# Patient Record
Sex: Female | Born: 1980 | Race: White | Hispanic: No | Marital: Married | Smoking: Never smoker
Health system: Southern US, Community
[De-identification: ages and names within clinical notes are randomized; demographics above are authoritative.]

## PROBLEM LIST (undated history)

## (undated) DIAGNOSIS — E282 Polycystic ovarian syndrome: Secondary | ICD-10-CM

## (undated) DIAGNOSIS — K5900 Constipation, unspecified: Secondary | ICD-10-CM

## (undated) DIAGNOSIS — B009 Herpesviral infection, unspecified: Secondary | ICD-10-CM

## (undated) DIAGNOSIS — E079 Disorder of thyroid, unspecified: Secondary | ICD-10-CM

## (undated) DIAGNOSIS — I10 Essential (primary) hypertension: Secondary | ICD-10-CM

## (undated) DIAGNOSIS — E119 Type 2 diabetes mellitus without complications: Secondary | ICD-10-CM

## (undated) HISTORY — DX: Herpesviral infection, unspecified: B00.9

## (undated) HISTORY — DX: Polycystic ovarian syndrome: E28.2

---

## 1998-08-20 ENCOUNTER — Other Ambulatory Visit: Admission: RE | Admit: 1998-08-20 | Discharge: 1998-08-20 | Payer: Self-pay | Admitting: Family Medicine

## 2001-05-16 ENCOUNTER — Emergency Department (HOSPITAL_COMMUNITY): Admission: EM | Admit: 2001-05-16 | Discharge: 2001-05-17 | Payer: Self-pay | Admitting: Emergency Medicine

## 2003-10-05 ENCOUNTER — Other Ambulatory Visit: Admission: RE | Admit: 2003-10-05 | Discharge: 2003-10-05 | Payer: Self-pay | Admitting: Obstetrics and Gynecology

## 2005-02-22 ENCOUNTER — Emergency Department (HOSPITAL_COMMUNITY): Admission: EM | Admit: 2005-02-22 | Discharge: 2005-02-22 | Payer: Self-pay | Admitting: Emergency Medicine

## 2005-02-28 ENCOUNTER — Other Ambulatory Visit: Admission: RE | Admit: 2005-02-28 | Discharge: 2005-02-28 | Payer: Self-pay | Admitting: Obstetrics and Gynecology

## 2006-04-16 ENCOUNTER — Other Ambulatory Visit: Admission: RE | Admit: 2006-04-16 | Discharge: 2006-04-16 | Payer: Self-pay | Admitting: Obstetrics and Gynecology

## 2010-01-27 ENCOUNTER — Encounter: Payer: Self-pay | Admitting: Sports Medicine

## 2011-02-08 ENCOUNTER — Ambulatory Visit (INDEPENDENT_AMBULATORY_CARE_PROVIDER_SITE_OTHER): Payer: 59 | Admitting: Family Medicine

## 2011-02-08 VITALS — BP 138/98 | HR 78 | Temp 99.4°F | Resp 18 | Ht 60.75 in | Wt 187.0 lb

## 2011-02-08 DIAGNOSIS — E042 Nontoxic multinodular goiter: Secondary | ICD-10-CM

## 2011-02-08 DIAGNOSIS — E039 Hypothyroidism, unspecified: Secondary | ICD-10-CM

## 2011-02-08 DIAGNOSIS — E049 Nontoxic goiter, unspecified: Secondary | ICD-10-CM

## 2011-02-08 MED ORDER — LEVOTHYROXINE SODIUM 50 MCG PO TABS: 50.0000 ug | ORAL_TABLET | Freq: Every day | ORAL | Status: DC

## 2011-02-08 NOTE — Progress Notes (Signed)
  Subjective:    Patient ID: Natasha Love, female    DOB: [birth year], 31 y.o.   MRN: [id number]  HPI 31 yo female here to discuss thyroid disease.  She went to gyn for menstrual irregularity [facility] - Lynden Ang).  01/28/11 TSH was 14.2.  Was told to see PCP for further treatment. Has had trouble with infertility and is hopeful fixing this will improve her fertility.    Review of Systems Menstrual irregularity, fatigue/hypersomnia, temperature extremes    Objective:   Physical Exam  Constitutional: She appears well-developed and well-nourished.  Neck: Thyromegaly present.       Thyroid nodularity on right  Cardiovascular: Normal rate, regular rhythm, normal heart sounds and intact distal pulses.   Pulmonary/Chest: Effort normal and breath sounds normal.          Assessment & Plan:  Hypothyroidism and Goiter/thyroid nodularity. Obtain ultrasound of thyroid.  Start levothyroxind daily and follow-up in 6 weeks for repeat labs.

## 2011-02-14 ENCOUNTER — Other Ambulatory Visit: Payer: Self-pay

## 2011-02-25 ENCOUNTER — Emergency Department (HOSPITAL_COMMUNITY)
Admission: EM | Admit: 2011-02-25 | Discharge: 2011-02-25 | Disposition: A | Discharge status: Home Health | Payer: Self-pay | Attending: Emergency Medicine | Admitting: Emergency Medicine

## 2011-02-25 ENCOUNTER — Encounter (HOSPITAL_COMMUNITY): Payer: Self-pay

## 2011-02-25 DIAGNOSIS — R110 Nausea: Secondary | ICD-10-CM | POA: Insufficient documentation

## 2011-02-25 MED ORDER — ONDANSETRON 4 MG PO TBDP: 4.0000 mg | ORAL_TABLET | Freq: Three times a day (TID) | ORAL | Status: AC | PRN

## 2011-02-25 MED ORDER — ONDANSETRON HCL 4 MG/2ML IJ SOLN
4.0000 mg | Freq: Once | INTRAMUSCULAR | Status: AC
  Administered 2011-02-25: 4 mg via INTRAMUSCULAR
  Filled 2011-02-25: qty 2

## 2011-02-25 NOTE — Discharge Instructions (Signed)
Nausea, Adult Nausea is the uncomfortable feeling you get in your stomach before you vomit. There are many causes of nausea and vomiting. Common examples include:   Viral infections.   Food poisoning.   Medications.   Pregnancy.   Motion sickness.   Migraine headaches.   Emotional distress.   Severe pain from any source.   Alcohol intoxication.  Treatment is aimed at the relief of symptoms. If able, prevention of vomiting and dehydration works best. Most viral infections and food poisoning symptoms will improve within 12 to 24 hours after the nausea first started. Some medications that cause nausea may be taken at bedtime. Avoid alcohol since this can irritate the stomach and make your symptoms worse. Get plenty of rest. Eat small amounts of food and sip liquids more often. Medications for nausea, vomiting, and motion sickness are given orally, by suppository, or by injection. SEEK IMMEDIATE MEDICAL CARE IF:   Your condition is not improved within 2 days or your condition gets worse in a few hours.   You develop repeated vomiting, blood in the vomit, or dark or bloody stools.   You develop severe chest or abdominal pain or a headache.   You develop fainting, extreme weakness, or body fluid loss (dehydration).   You have an oral temperature above 102 F (38.9 C), not controlled by medicine.  Document Released: 01/31/2004 Document Revised: 07/08/2010 Document Reviewed: 02/24/2008 Exit[employer] Patient Information 2012 [employer]. 

## 2011-02-25 NOTE — ED Provider Notes (Signed)
History     CSN: [id number]  Arrival date & time 02/25/11  1341   First MD Initiated Contact with Patient 02/25/11 1535      Chief Complaint  Patient presents with  . Nausea    (Consider location/radiation/quality/duration/timing/severity/associated sxs/prior treatment) HPI The patient presents to the emergency department with complaints of nausea. Patient was prescribed amoxicillin and Vicodin yesterday by her dentist for dental abscess. She states that since starting the medication she's been feeling very chest. She denies any episodes of vomiting, diarrhea, facial swelling, tongue swelling, shortness of breath, sensation of her throat closing, or any other related symptoms. She states that she has been drinking less water because of the nausea and that she needed something for this.   History reviewed. No pertinent past medical history.  History reviewed. No pertinent past surgical history.  No family history on file.  History  Substance Use Topics  . Smoking status: Never Smoker   . Smokeless tobacco: Not on file  . Alcohol Use: No    OB History    Grav Para Term Preterm Abortions TAB SAB Ect Mult Living                  Review of Systems  All other systems reviewed and are negative.    Allergies  Review of patient's allergies indicates no known allergies.  Home Medications   Current Outpatient Rx  Name Route Sig Dispense Refill  . AMOXICILLIN 500 MG PO CAPS Oral Take 500 mg by mouth every 6 (six) hours. Starting on 02/24/11 for 6 days    . HYDROCODONE-ACETAMINOPHEN 7.5-750 MG PO TABS Oral Take 1 tablet by mouth every 4 (four) hours as needed. For pain    . LEVOTHYROXINE SODIUM 50 MCG PO TABS Oral Take 1 tablet (50 mcg total) by mouth daily. 30 tablet 1  . ONDANSETRON 4 MG PO TBDP Oral Take 1 tablet (4 mg total) by mouth every 8 (eight) hours as needed for nausea. 20 tablet 0    BP 117/70  Pulse 64  Temp(Src) 98.2 F (36.8 C) (Oral)  Resp 16  Ht 5'  (1.524 m)  Wt 187 lb (84.823 kg)  BMI 36.52 kg/m2  SpO2 95%  LMP 02/09/2011  Physical Exam  Constitutional: She appears well-developed and well-nourished. No distress.  HENT:  Head: Normocephalic and atraumatic.  Mouth/Throat: Uvula is midline, oropharynx is clear and moist and mucous membranes are normal. Normal dentition. Dental caries (Pts tooth shows no obvious abscess but moderate to severe tenderness to palpation of marked tooth) present. No uvula swelling.  Eyes: Pupils are equal, round, and reactive to light.  Neck: Trachea normal, normal range of motion and full passive range of motion without pain. Neck supple.  Cardiovascular: Normal rate, regular rhythm, normal heart sounds and normal pulses.   Pulmonary/Chest: Effort normal and breath sounds normal. No respiratory distress. Chest wall is not dull to percussion. She exhibits no crepitus, no edema, no deformity and no retraction.  Abdominal: Soft. Normal appearance and bowel sounds are normal. She exhibits no distension. There is no tenderness. There is no guarding.  Musculoskeletal: Normal range of motion.  Neurological: She is alert. She has normal strength.  Skin: Skin is warm, dry and intact. She is not diaphoretic.  Psychiatric: She has a normal mood and affect. Her speech is normal.    ED Course  Procedures (including critical care time)  Labs Reviewed - No data to display No results found.   1. Nausea  MDM  Patient given shot of Zofran in ED, on reevaluation the patient is feeling much better and no longer having nausea. Pt able to drink cup of water without any problems.  Will give patient prescription for Zofran and have her followup with her dentist at her appointment scheduled for Friday for further management of her dental pain.        Dorthula Matas, PA 02/26/11 0020

## 2011-02-25 NOTE — ED Notes (Signed)
Pt. Is getting a tooth  Extracted next Friday, and her dentist called her in Amoxicillin and Vicodin and 1 hour after taking these meds she has had nausea.

## 2011-02-25 NOTE — ED Notes (Signed)
Pt c/o upper double impacted wisdom tooth and abscess to lower teeth, pt c/o nausea after taking her prescribed pain medication and dizzy. Pt states "I think this is from my pain medication."

## 2011-02-26 NOTE — ED Provider Notes (Signed)
Medical screening examination/treatment/procedure(s) were performed by non-physician practitioner and as supervising physician I was immediately available for consultation/collaboration.  Doug Sou, MD 02/26/11 [phone]

## 2011-12-22 ENCOUNTER — Ambulatory Visit: Payer: BC Managed Care – PPO

## 2013-05-06 ENCOUNTER — Encounter (HOSPITAL_BASED_OUTPATIENT_CLINIC_OR_DEPARTMENT_OTHER): Payer: Self-pay | Admitting: Emergency Medicine

## 2013-05-06 ENCOUNTER — Emergency Department (HOSPITAL_BASED_OUTPATIENT_CLINIC_OR_DEPARTMENT_OTHER)
Admission: EM | Admit: 2013-05-06 | Discharge: 2013-05-06 | Disposition: A | Discharge status: Home / Self Care | Payer: BC Managed Care – PPO | Attending: Emergency Medicine | Admitting: Emergency Medicine

## 2013-05-06 DIAGNOSIS — K602 Anal fissure, unspecified: Secondary | ICD-10-CM | POA: Insufficient documentation

## 2013-05-06 DIAGNOSIS — Z79899 Other long term (current) drug therapy: Secondary | ICD-10-CM | POA: Insufficient documentation

## 2013-05-06 DIAGNOSIS — E119 Type 2 diabetes mellitus without complications: Secondary | ICD-10-CM | POA: Insufficient documentation

## 2013-05-06 DIAGNOSIS — I10 Essential (primary) hypertension: Secondary | ICD-10-CM | POA: Insufficient documentation

## 2013-05-06 DIAGNOSIS — E079 Disorder of thyroid, unspecified: Secondary | ICD-10-CM | POA: Insufficient documentation

## 2013-05-06 DIAGNOSIS — K5900 Constipation, unspecified: Secondary | ICD-10-CM | POA: Insufficient documentation

## 2013-05-06 DIAGNOSIS — K5909 Other constipation: Secondary | ICD-10-CM

## 2013-05-06 DIAGNOSIS — R439 Unspecified disturbances of smell and taste: Secondary | ICD-10-CM | POA: Insufficient documentation

## 2013-05-06 DIAGNOSIS — F411 Generalized anxiety disorder: Secondary | ICD-10-CM | POA: Insufficient documentation

## 2013-05-06 HISTORY — DX: Constipation, unspecified: K59.00

## 2013-05-06 HISTORY — DX: Type 2 diabetes mellitus without complications: E11.9

## 2013-05-06 HISTORY — DX: Disorder of thyroid, unspecified: E07.9

## 2013-05-06 HISTORY — DX: Essential (primary) hypertension: I10

## 2013-05-06 LAB — CBG MONITORING, ED: Glucose-Capillary: 147 mg/dL — ABNORMAL HIGH (ref 70–99)

## 2013-05-06 LAB — OCCULT BLOOD X 1 CARD TO LAB, STOOL: Fecal Occult Bld: NEGATIVE

## 2013-05-06 NOTE — ED Notes (Signed)
Constipation, foul smelling stool and blood in stool for a number of months.  Pt sts she noticed very bad breath yesterday morning and it concerns her in combination with her other sx and her diabetes.  Sts she has never had bad breath before.

## 2013-05-06 NOTE — ED Provider Notes (Signed)
CSN: [id number]     Arrival date & time 05/06/13  0107 History   First MD Initiated Contact with Patient 05/06/13 0132     Chief Complaint  Patient presents with  . Constipation     (Consider location/radiation/quality/duration/timing/severity/associated sxs/prior Treatment) HPI This is a 33 year old female with a one-day history of the sensation that she has bad breath. When she breathes she smells a foul odor that she has not smelled in the past. She also senses his other in her feces. She has had chronic constipation for several months. Sometimes she has small amounts of blood mixed with the stool but none recently. She states she is anxious about the smell and was to make sure something serious is going on. She denies fever. She denies nasal congestion. She denies postnasal drip. She denies sore throat. She denies dental pain. She states she is compliant with her Synthroid.  Past Medical History  Diagnosis Date  . Diabetes mellitus without complication   . Hypertension   . Constipation   . Thyroid disease    History reviewed. No pertinent past surgical history. No family history on file. History  Substance Use Topics  . Smoking status: Never Smoker   . Smokeless tobacco: Not on file  . Alcohol Use: No   OB History   Grav Para Term Preterm Abortions TAB SAB Ect Mult Living                 Review of Systems  All other systems reviewed and are negative.   Allergies  Review of patient's allergies indicates no known allergies.  Home Medications   Prior to Admission medications   Medication Sig Start Date End Date Taking? Authorizing Provider  glipiZIDE (GLUCOTROL) 5 MG tablet Take by mouth daily before breakfast.   Yes Historical Provider, MD  lisinopril (PRINIVIL,ZESTRIL) 10 MG tablet Take 10 mg by mouth daily.   Yes Historical Provider, MD  amoxicillin (AMOXIL) 500 MG capsule Take 500 mg by mouth every 6 (six) hours. Starting on 02/24/11 for 6 days    Historical Provider,  MD  HYDROcodone-acetaminophen (VICODIN ES) 7.5-750 MG per tablet Take 1 tablet by mouth every 4 (four) hours as needed. For pain    Historical Provider, MD  levothyroxine (SYNTHROID, LEVOTHROID) 50 MCG tablet Take 1 tablet (50 mcg total) by mouth daily. 02/08/11 02/08/12  Lamar LaundryLaura C Mayans, MD   BP 154/115  Pulse 74  Temp(Src) 98.5 F (36.9 C) (Oral)  Resp 20  Ht 5' (1.524 m)  Wt 181 lb (82.101 kg)  BMI 35.35 kg/m2  SpO2 98%  LMP 05/02/2013  Physical Exam General: Well-developed, well-nourished female in no acute distress; appearance consistent with age of record HENT: normocephalic; atraumatic; no nasal congestion; no pharyngeal erythema, exudate or edema; good dentition without obvious caries; normal smelling breath both orally and nasally Eyes: pupils equal, round and reactive to light; extraocular muscles intact Neck: supple Heart: regular rate and rhythm Lungs: clear to auscultation bilaterally Abdomen: soft; nondistended; nontender; bowel sounds present Rectal: Small anterior midline fissure; normal sphincter tone; no hemorrhoids; no stool in vault Extremities: No deformity; full range of motion; pulses normal Neurologic: Awake, alert and oriented; motor function intact in all extremities and symmetric; no facial droop Skin: Warm and dry Psychiatric: Mildly anxious    ED Course  Procedures (including critical care time)   MDM   Nursing notes and vitals signs, including pulse oximetry, reviewed.  Summary of this visit's results, reviewed by myself:  Labs:  Results for orders placed during the hospital encounter of 05/06/13 (from the past 24 hour(s))  CBG MONITORING, ED     Status: Abnormal   Collection Time    05/06/13  1:23 AM      Result Value Ref Range   Glucose-Capillary 147 (*) 70 - 99 mg/dL  OCCULT BLOOD X 1 CARD TO LAB, STOOL     Status: None   Collection Time    05/06/13  1:40 AM      Result Value Ref Range   Fecal Occult Bld NEGATIVE  NEGATIVE   The cause  of the patient's altered olfactory sense are not apparent on exam. She may have an early sinusitis. She was advised that should symptoms persist or should she develop nasal congestion or postnasal drip that she should be reevaluated. She was also advised to take a fiber supplement or MiraLax as needed for constipation. The bleeding may be due to the anal fissure seen.    Hanley SeamenJohn L Nehemie Casserly, MD 05/06/13 [phone]

## 2013-06-24 ENCOUNTER — Ambulatory Visit (INDEPENDENT_AMBULATORY_CARE_PROVIDER_SITE_OTHER): Payer: BC Managed Care – PPO | Admitting: Podiatry

## 2013-06-24 ENCOUNTER — Encounter: Payer: Self-pay | Admitting: Podiatry

## 2013-06-24 VITALS — BP 128/96 | HR 73 | Ht 60.0 in | Wt 181.0 lb

## 2013-06-24 DIAGNOSIS — M216X9 Other acquired deformities of unspecified foot: Secondary | ICD-10-CM | POA: Insufficient documentation

## 2013-06-24 DIAGNOSIS — M216X2 Other acquired deformities of left foot: Secondary | ICD-10-CM

## 2013-06-24 DIAGNOSIS — M21962 Unspecified acquired deformity of left lower leg: Secondary | ICD-10-CM

## 2013-06-24 DIAGNOSIS — M21969 Unspecified acquired deformity of unspecified lower leg: Secondary | ICD-10-CM | POA: Insufficient documentation

## 2013-06-24 DIAGNOSIS — M722 Plantar fascial fibromatosis: Secondary | ICD-10-CM

## 2013-06-24 NOTE — Progress Notes (Signed)
Subjective: 33 year old female presents complaining of pain in both feet. Has had heel pain x 3 months, excruciating for the last month, L>R. 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  Wears several different types of shoes. Pain is better after been out of work. Mostly left plantar lateral heel pain.  Objective: Dermatologic: No abnormal findings. Neurovascular status are within normal. Orthopedic: High Arched Cavus type foot with weak and elevated first ray L>R.  Enlarged medial eminence of the first metatarsal bone bilateral.  Assessment: Plantar fasciitis left. Elevated and hypermobile first ray L>R. 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  Plan:  Reviewed findings and available treatment options. Injection given to left heel. Injected with mixture of 4 mg Dexamethasone, 4 mg Triamcinolone, and 1 cc of 0.5% Marcaine plain. Patient tolerated well without difficulty.  Metatarsal binder placed in left foot. Ankle brace placed in left foot to reduce abnormal compensatory pronation.

## 2013-06-24 NOTE — Patient Instructions (Signed)
Seen for pain in left heel. Noted of weakened first Metatarsal bone L>R. Cortisone injection give. Wrapped with Metatarsal binder and Ankle brace. Need Orthotic inserts. Return in 2 weeks.

## 2013-07-06 ENCOUNTER — Ambulatory Visit: Payer: BC Managed Care – PPO | Admitting: Podiatry

## 2013-07-13 ENCOUNTER — Ambulatory Visit: Payer: BC Managed Care – PPO | Admitting: Podiatry

## 2013-11-08 ENCOUNTER — Encounter: Payer: Self-pay | Admitting: Gynecology

## 2013-11-08 ENCOUNTER — Other Ambulatory Visit (HOSPITAL_COMMUNITY)
Admission: RE | Admit: 2013-11-08 | Discharge: 2013-11-08 | Disposition: A | Discharge status: Home / Self Care | Payer: BC Managed Care – PPO | Source: Ambulatory Visit | Attending: Gynecology | Admitting: Gynecology

## 2013-11-08 ENCOUNTER — Ambulatory Visit (INDEPENDENT_AMBULATORY_CARE_PROVIDER_SITE_OTHER): Payer: BC Managed Care – PPO | Admitting: Gynecology

## 2013-11-08 VITALS — BP 134/88 | Ht 61.0 in | Wt 183.0 lb

## 2013-11-08 DIAGNOSIS — Z1151 Encounter for screening for human papillomavirus (HPV): Secondary | ICD-10-CM | POA: Diagnosis present

## 2013-11-08 DIAGNOSIS — Z8742 Personal history of other diseases of the female genital tract: Secondary | ICD-10-CM | POA: Insufficient documentation

## 2013-11-08 DIAGNOSIS — Z01419 Encounter for gynecological examination (general) (routine) without abnormal findings: Secondary | ICD-10-CM

## 2013-11-08 DIAGNOSIS — N915 Oligomenorrhea, unspecified: Secondary | ICD-10-CM | POA: Insufficient documentation

## 2013-11-08 DIAGNOSIS — R6882 Decreased libido: Secondary | ICD-10-CM | POA: Insufficient documentation

## 2013-11-08 MED ORDER — FLIBANSERIN 100 MG PO TABS: 100.0000 mg | ORAL_TABLET | Freq: Every day | ORAL | Status: DC

## 2013-11-08 MED ORDER — MEDROXYPROGESTERONE ACETATE 10 MG PO TABS: ORAL_TABLET | ORAL | Status: DC

## 2013-11-08 NOTE — Patient Instructions (Signed)
Addyi REMS Program                                                                Patient Provider Agreement Form  Healthcare Provider: -Alcohol use is contraindicated in women taking Addyi -Addyi and alcohol and tract and increase the risk of severe hypotension and syncope -Patient - Provider Agreement is being placed in the patient's electronic medical record, lining these risk as well as the importance of abstaining from alcohol  Patient: Patient understands that she was not drink alcohol while taking Addyi. Drinking alcohol during treatment with Addyi has been shown to increase the risk of severe low blood pressure and fainting (loss of consciousness).  -Patient understands that if she feels lightheaded or dizziness she will lie down immediately and seek medical attention if the symptoms do not go away -Patient understands that if she faints (any  loss of consciousness), that she will contact her medical provider as soon as possible -Patient understands that she should take Addyi at bedtime -Patient understands that if she misses a dose she will skip to miss dose. She will take her next dose the following day at bedtime -Patient fully understands these instructions have been provided by me as her provider.  

## 2013-11-08 NOTE — Progress Notes (Signed)
[place] CharonMiranda M Downs 1980-08-09 [id number]   History:    33 y.o.  for annual gyn exam who is a new patient to the practice. She has not had had a gynecological exam in over 2 years. Patient denies any past history of any abnormal Pap smear. 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 Also patient has been diagnosed in the past with polycystic ovarian syndrome and for this reason she has menses every 2-3 months. She had a menstrual cycle in March, June, and August and then in September and October were normal. She denied any unusual hair growth. She is being treated by her PCP for type 2 diabetes and hypothyroidism. Patient has been complaining of decreased libido. Patient denies any visual disturbances, galactorrhea or any unusual headaches. Patient not using any form of contraception.Patient not interested in infertility evaluation or treatment.   Past medical history,surgical history, family history and social history were all reviewed and documented in the EPIC chart.  Gynecologic History Patient's last menstrual period was 10/23/2013. Contraception: none Last Pap: 2-3 years ago. Results were: normal Last mammogram: not indicated. Results were: not indicated  Obstetric History OB History  Gravida Para Term Preterm AB SAB TAB Ectopic Multiple Living  0 0 0 0 0 0 0 0 0 0          ROS: A ROS was performed and pertinent positives and negatives are included in the history.  GENERAL: No fevers or chills. HEENT: No change in vision, no earache, sore throat or sinus congestion. NECK: No pain or stiffness. CARDIOVASCULAR: No chest pain or pressure. No palpitations. PULMONARY: No shortness of breath, cough or wheeze. GASTROINTESTINAL: No abdominal pain, nausea, vomiting or diarrhea, melena or bright red blood per rectum. GENITOURINARY: No urinary frequency, urgency, hesitancy or dysuria. MUSCULOSKELETAL: No joint or muscle pain, no back pain, no recent  trauma. DERMATOLOGIC: No rash, no itching, no lesions. ENDOCRINE: No polyuria, polydipsia, no heat or cold intolerance. No recent change in weight. HEMATOLOGICAL: No anemia or easy bruising or bleeding. NEUROLOGIC: No headache, seizures, numbness, tingling or weakness. PSYCHIATRIC: No depression, no loss of interest in normal activity or change in sleep pattern.     Exam: chaperone present  BP 134/88 mmHg  Ht 5\' 1"  (1.549 m)  Wt 183 lb (83.008 kg)  BMI 34.60 kg/m2  LMP 10/23/2013  Body mass index is 34.6 kg/(m^2).  General appearance : Well developed well nourished female. No acute distress HEENT: Neck supple, trachea midline, no carotid bruits, no thyroidmegaly Lungs: Clear to auscultation, no rhonchi or wheezes, or rib retractions  Heart: Regular rate and rhythm, no murmurs or gallops Breast:Examined in sitting and supine position were symmetrical in appearance, no palpable masses or tenderness,  no skin retraction, no nipple inversion, no nipple discharge, no skin discoloration, no axillary or supraclavicular lymphadenopathy Abdomen: no palpable masses or tenderness, no rebound or guarding Extremities: no edema or skin discoloration or tenderness  Pelvic:  Bartholin, Urethra, Skene Glands: Within normal limits             Vagina: No gross lesions or discharge  Cervix: No gross lesions or discharge  Uterus  anteverted, normal size, shape and consistency, non-tender and mobile  Adnexa  Without masses or tenderness  Anus and perineum  normal   Rectovaginal  normal sphincter tone without palpated masses or tenderness             Hemoccult not indicated  Assessment/Plan:  33 y.o. female for annual exam with history of PCO S. We discussed prescribing Provera 10 mg for 10 days whenever her cycles do not come on spontaneously every 35 days. She will do a home pregnancy test first before taking the medication. I have recommended she begin taking prenatal vitamins and the events that  she were to conceive.for her decreased libido she is going to be prescribed ADDYI 100 mg daily at bedtime. The following risks were discussed and a handout was provided.                                                                   Addyi REMS Program                                                                Patient Provider Agreement Form  Healthcare Provider: -Alcohol use is contraindicated in women taking Addyi -Addyi and alcohol and tract and increase the risk of severe hypotension and syncope -Patient - Provider Agreement is being placed in the patient's electronic medical record, lining these risk as well as the importance of abstaining from alcohol  Patient: Patient understands that she was not drink alcohol while taking Addyi. Drinking alcohol during treatment with Addyi has been shown to increase the risk of severe low blood pressure and fainting (loss of consciousness).  -Patient understands that if she feels lightheaded or dizziness she will lie down immediately and seek medical attention if the symptoms do not go away -Patient understands that if she faints (any  loss of consciousness), that she will contact her medical provider as soon as possible -Patient understands that she should take Addyi at bedtime -Patient understands that if she misses a dose she will skip to miss dose. She will take her next dose the following day at bedtime -Patient fully understands these instructions have been provided by me as her provider.   An ultrasound will be ordered in the next few weeks to better assess her ovaries since she slightly overweight for better assessment of her record so. Her PCP we'll be doing the rest of her blood work. Pap smear was done today. Patient declined flu vaccine. Patient did have the teeth out vaccine today.   Ok EdwardsFERNANDEZ,Brooke Steinhilber H MD, 12:27 PM 11/08/2013

## 2013-11-10 LAB — CYTOLOGY - PAP

## 2013-11-21 ENCOUNTER — Other Ambulatory Visit: Payer: Self-pay | Admitting: Gynecology

## 2013-11-21 ENCOUNTER — Encounter: Payer: Self-pay | Admitting: Gynecology

## 2013-11-21 ENCOUNTER — Ambulatory Visit (INDEPENDENT_AMBULATORY_CARE_PROVIDER_SITE_OTHER): Payer: BC Managed Care – PPO | Admitting: Gynecology

## 2013-11-21 ENCOUNTER — Ambulatory Visit (INDEPENDENT_AMBULATORY_CARE_PROVIDER_SITE_OTHER): Payer: BC Managed Care – PPO

## 2013-11-21 VITALS — BP 116/78

## 2013-11-21 DIAGNOSIS — R9389 Abnormal findings on diagnostic imaging of other specified body structures: Secondary | ICD-10-CM

## 2013-11-21 DIAGNOSIS — E663 Overweight: Secondary | ICD-10-CM

## 2013-11-21 DIAGNOSIS — R938 Abnormal findings on diagnostic imaging of other specified body structures: Secondary | ICD-10-CM

## 2013-11-21 DIAGNOSIS — E282 Polycystic ovarian syndrome: Secondary | ICD-10-CM

## 2013-11-21 DIAGNOSIS — N915 Oligomenorrhea, unspecified: Secondary | ICD-10-CM

## 2013-11-21 DIAGNOSIS — Z8742 Personal history of other diseases of the female genital tract: Secondary | ICD-10-CM

## 2013-11-21 DIAGNOSIS — Z309 Encounter for contraceptive management, unspecified: Secondary | ICD-10-CM

## 2013-11-21 DIAGNOSIS — Z30011 Encounter for initial prescription of contraceptive pills: Secondary | ICD-10-CM

## 2013-11-21 MED ORDER — LEVONORGESTREL-ETHINYL ESTRAD 0.1-20 MG-MCG PO TABS: 1.0000 | ORAL_TABLET | Freq: Every day | ORAL | Status: DC

## 2013-11-21 NOTE — Progress Notes (Signed)
   Patient is a 33 year old gravida 0 who was seen in the office for the first time the early part of this month. Patient with known history of polycystic ovarian syndrome and oligomenorrhea where she would go up to 2-3 months without a cycle.She is being treated by her PCP for type 2 diabetes and hypothyroidism.patient has informed me now that she is no longer interested in getting pregnant and would like to go on the birth control pill if there is a possibility for still conceiving. She also been complaining of decreased libido and was started on ADDYI 100 mg daily at bedtime and has done well with no side effects.she is here today for better assessment of her ovaries since she slightly overweight and history of PCOS.recent Pap smear normal. PCP doing her blood work.  Ultrasound: Uterus measured 8.1 x 5.2 x 3.9 cm with endometrial stripe of 9.2 mm (these last menstrual period 10/24/2013) (. Right and left ovary with numerous follicles. Right ovary normal volume left ovary increase volume. No fluid in the cul-de-sac. No apparent adnexal masses.  Assessment/plan: #1 overweight BMI 34.60/PCOS/chronic anovulation #2 decreased libido responding well to ADDYI 100 mg daily  #3 need for contraception and cycle control will be started on Allesse 28 day oral contraceptive pill.the risks benefits and pros and cons of oral contraceptive pills were discussed with the patient to include DVT and pulmonary embolism. Patient denies any family history of any clotting disorders. Many years ago patient had been on oral contraceptive pills with no problems. #4 follow-up in one year or when necessary.

## 2013-11-21 NOTE — Patient Instructions (Signed)

## 2014-01-26 ENCOUNTER — Other Ambulatory Visit: Payer: Self-pay | Admitting: Family Medicine

## 2014-01-26 DIAGNOSIS — N909 Noninflammatory disorder of vulva and perineum, unspecified: Secondary | ICD-10-CM

## 2014-01-30 ENCOUNTER — Ambulatory Visit
Admission: RE | Admit: 2014-01-30 | Discharge: 2014-01-30 | Disposition: A | Discharge status: Home / Self Care | Payer: 59 | Source: Ambulatory Visit | Attending: Family Medicine | Admitting: Family Medicine

## 2014-01-30 DIAGNOSIS — N909 Noninflammatory disorder of vulva and perineum, unspecified: Secondary | ICD-10-CM

## 2014-01-31 ENCOUNTER — Telehealth: Payer: Self-pay | Admitting: *Deleted

## 2014-01-31 NOTE — Telephone Encounter (Signed)
Dr.Fernandez pt had a ultrasound done on 01/30/14 in epic asked if you could review. PCP said it may shown a cyst? Please advise

## 2014-01-31 NOTE — Telephone Encounter (Signed)
Pt called asking JF to review an ultrasound report, I left message for pt to call.

## 2014-02-01 NOTE — Telephone Encounter (Signed)
Patient will have to make an appointment to be examined. I do not see her PCP note. I just saw the ultrasound. It could be a sebaceous cysts although I need to examine her before I can make any conclusions.

## 2014-02-01 NOTE — Telephone Encounter (Signed)
Left on pt voicemail to make OV.

## 2014-04-06 ENCOUNTER — Encounter: Payer: Self-pay | Admitting: Gynecology

## 2014-04-06 ENCOUNTER — Ambulatory Visit (INDEPENDENT_AMBULATORY_CARE_PROVIDER_SITE_OTHER): Payer: 59 | Admitting: Gynecology

## 2014-04-06 VITALS — BP 130/80

## 2014-04-06 DIAGNOSIS — L739 Follicular disorder, unspecified: Secondary | ICD-10-CM

## 2014-04-06 MED ORDER — FLUCONAZOLE 150 MG PO TABS: 150.0000 mg | ORAL_TABLET | Freq: Once | ORAL | Status: DC

## 2014-04-06 MED ORDER — CEPHALEXIN 500 MG PO CAPS: 500.0000 mg | ORAL_CAPSULE | Freq: Four times a day (QID) | ORAL | Status: DC

## 2014-04-06 NOTE — Progress Notes (Signed)
   Patient presented to the office today stating that for the past year she has noted a "lump" near her right groin region. She had gone to her primary care physician early this year and was sent for an ultrasound with the following report noted:  Real-time ultrasound was performed of the vulva in the region of clinical concern.  COMPARISON: None.  FINDINGS: A 0.5 x 0.2 x 0.6 cm hypoechoic lesion is noted. Subtle internal echoes may be present. Although this may represent a cyst solid lesion cannot be excluded.  IMPRESSION: 0.5 x 0.2 x 0.6 cm hypoechoic lesion in the region of clinical concern. Subtle internal echoes cannot be excluded and although this may represent a cyst, a solid lesion cannot be completely excluded.  Patient stated that it resolved and this past weekend return back again. Patient is no longer on oral contraceptive pill. She's having normal cycles only skipped the month of February. Patient with history of PCO S see previous note for details.  Exam: Bartholin urethra Skene was within normal limits vagina with grown no gross lesions on inspection oral palpation. The area that she describes as more towards the mons pubis it appears to be a small epidermal inclusion cyst is not reddened or tender on contact.  Assessment/plan: Epidermal inclusion cyst/folliculitis we'll give her a trial of Keflex 500 mg twice a day for 10 days. She was given a protrusion Diflucan in the event of a yeast infection to take 1 tablet. Patient to do sitz baths at least once nightly for the next 7-10 days. If he can or bothersome she'll return back to the office we'll consider doing an incision and drainage.

## 2014-04-06 NOTE — Patient Instructions (Signed)
Epidermal Cyst An epidermal cyst is sometimes called a sebaceous cyst, epidermal inclusion cyst, or infundibular cyst. These cysts usually contain a substance that looks "pasty" or "cheesy" and may have a bad smell. This substance is a protein called keratin. Epidermal cysts are usually found on the face, neck, or trunk. They may also occur in the vaginal area or other parts of the genitalia of both men and women. Epidermal cysts are usually small, painless, slow-growing bumps or lumps that move freely under the skin. It is important not to try to pop them. This may cause an infection and lead to tenderness and swelling. CAUSES  Epidermal cysts may be caused by a deep penetrating injury to the skin or a plugged hair follicle, often associated with acne. SYMPTOMS  Epidermal cysts can become inflamed and cause:  Redness.  Tenderness.  Increased temperature of the skin over the bumps or lumps.  Grayish-white, bad smelling material that drains from the bump or lump. DIAGNOSIS  Epidermal cysts are easily diagnosed by your caregiver during an exam. Rarely, a tissue sample (biopsy) may be taken to rule out other conditions that may resemble epidermal cysts. TREATMENT   Epidermal cysts often get better and disappear on their own. They are rarely ever cancerous.  If a cyst becomes infected, it may become inflamed and tender. This may require opening and draining the cyst. Treatment with antibiotics may be necessary. When the infection is gone, the cyst may be removed with minor surgery.  Small, inflamed cysts can often be treated with antibiotics or by injecting steroid medicines.  Sometimes, epidermal cysts become large and bothersome. If this happens, surgical removal in your caregiver's office may be necessary. HOME CARE INSTRUCTIONS  Only take over-the-counter or prescription medicines as directed by your caregiver.  Take your antibiotics as directed. Finish them even if you start to feel  better. SEEK MEDICAL CARE IF:   Your cyst becomes tender, red, or swollen.  Your condition is not improving or is getting worse.  You have any other questions or concerns. MAKE SURE YOU:  Understand these instructions.  Will watch your condition.  Will get help right away if you are not doing well or get worse. Document Released: 11/24/2003 Document Revised: 03/17/2011 Document Reviewed: 07/01/2010 Exit[employer] Patient Information 2015 [employer]. This information is not intended to replace advice given to you by your health care provider. Make sure you discuss any questions you have with your health care provider.  

## 2014-11-16 ENCOUNTER — Encounter: Payer: 59 | Admitting: Gynecology

## 2015-02-10 ENCOUNTER — Emergency Department (HOSPITAL_BASED_OUTPATIENT_CLINIC_OR_DEPARTMENT_OTHER)
Admission: EM | Admit: 2015-02-10 | Discharge: 2015-02-10 | Disposition: A | Discharge status: Home / Self Care | Payer: 59 | Attending: Emergency Medicine | Admitting: Emergency Medicine

## 2015-02-10 ENCOUNTER — Emergency Department (HOSPITAL_BASED_OUTPATIENT_CLINIC_OR_DEPARTMENT_OTHER): Payer: 59

## 2015-02-10 ENCOUNTER — Encounter (HOSPITAL_BASED_OUTPATIENT_CLINIC_OR_DEPARTMENT_OTHER): Payer: Self-pay | Admitting: *Deleted

## 2015-02-10 DIAGNOSIS — M549 Dorsalgia, unspecified: Secondary | ICD-10-CM

## 2015-02-10 DIAGNOSIS — E119 Type 2 diabetes mellitus without complications: Secondary | ICD-10-CM | POA: Insufficient documentation

## 2015-02-10 DIAGNOSIS — E079 Disorder of thyroid, unspecified: Secondary | ICD-10-CM | POA: Insufficient documentation

## 2015-02-10 DIAGNOSIS — Z8619 Personal history of other infectious and parasitic diseases: Secondary | ICD-10-CM | POA: Diagnosis not present

## 2015-02-10 DIAGNOSIS — Z79899 Other long term (current) drug therapy: Secondary | ICD-10-CM | POA: Insufficient documentation

## 2015-02-10 DIAGNOSIS — Z792 Long term (current) use of antibiotics: Secondary | ICD-10-CM | POA: Insufficient documentation

## 2015-02-10 DIAGNOSIS — I10 Essential (primary) hypertension: Secondary | ICD-10-CM | POA: Diagnosis not present

## 2015-02-10 DIAGNOSIS — G8929 Other chronic pain: Secondary | ICD-10-CM | POA: Diagnosis not present

## 2015-02-10 DIAGNOSIS — M542 Cervicalgia: Secondary | ICD-10-CM | POA: Diagnosis not present

## 2015-02-10 DIAGNOSIS — Z8719 Personal history of other diseases of the digestive system: Secondary | ICD-10-CM | POA: Insufficient documentation

## 2015-02-10 DIAGNOSIS — M546 Pain in thoracic spine: Secondary | ICD-10-CM | POA: Diagnosis present

## 2015-02-10 MED ORDER — NAPROXEN 500 MG PO TABS: 500.0000 mg | ORAL_TABLET | Freq: Two times a day (BID) | ORAL | Status: AC

## 2015-02-10 MED ORDER — METHOCARBAMOL 500 MG PO TABS: 1000.0000 mg | ORAL_TABLET | Freq: Three times a day (TID) | ORAL | Status: DC

## 2015-02-10 NOTE — ED Notes (Signed)
DC instructions reviewed with pt, reviewed Rx's x 2 with pt as well, offered opportunity for questions, Teach Back Method Used

## 2015-02-10 NOTE — ED Notes (Signed)
States has not taken am dose of antihypertensive

## 2015-02-10 NOTE — ED Notes (Signed)
States has had neck pain, radiates to upper part of back, for the past three months. Would like to be evaluated to see if back is "worse" than before

## 2015-02-10 NOTE — Discharge Instructions (Signed)
Back Pain, Adult There is no evidence of a spinal cord problem. Followup with your doctor. Return to the ED if you develop weakness, numbness, tingling, or any other concerns. Back pain is very common in adults.The cause of back pain is rarely dangerous and the pain often gets better over time.The cause of your back pain may not be known. Some common causes of back pain include:  Strain of the muscles or ligaments supporting the spine.  Wear and tear (degeneration) of the spinal disks.  Arthritis.  Direct injury to the back. For many people, back pain may return. Since back pain is rarely dangerous, most people can learn to manage this condition on their own. HOME CARE INSTRUCTIONS Watch your back pain for any changes. The following actions may help to lessen any discomfort you are feeling:  Remain active. It is stressful on your back to sit or stand in one place for long periods of time. Do not sit, drive, or stand in one place for more than 30 minutes at a time. Take short walks on even surfaces as soon as you are able.Try to increase the length of time you walk each day.  Exercise regularly as directed by your health care provider. Exercise helps your back heal faster. It also helps avoid future injury by keeping your muscles strong and flexible.  Do not stay in bed.Resting more than 1-2 days can delay your recovery.  Pay attention to your body when you bend and lift. The most comfortable positions are those that put less stress on your recovering back. Always use proper lifting techniques, including:  Bending your knees.  Keeping the load close to your body.  Avoiding twisting.  Find a comfortable position to sleep. Use a firm mattress and lie on your side with your knees slightly bent. If you lie on your back, put a pillow under your knees.  Avoid feeling anxious or stressed.Stress increases muscle tension and can worsen back pain.It is important to recognize when you are  anxious or stressed and learn ways to manage it, such as with exercise.  Take medicines only as directed by your health care provider. Over-the-counter medicines to reduce pain and inflammation are often the most helpful.Your health care provider may prescribe muscle relaxant drugs.These medicines help dull your pain so you can more quickly return to your normal activities and healthy exercise.  Apply ice to the injured area:  Put ice in a plastic bag.  Place a towel between your skin and the bag.  Leave the ice on for 20 minutes, 2-3 times a day for the first 2-3 days. After that, ice and heat may be alternated to reduce pain and spasms.  Maintain a healthy weight. Excess weight puts extra stress on your back and makes it difficult to maintain good posture. SEEK MEDICAL CARE IF:  You have pain that is not relieved with rest or medicine.  You have increasing pain going down into the legs or buttocks.  You have pain that does not improve in one week.  You have night pain.  You lose weight.  You have a fever or chills. SEEK IMMEDIATE MEDICAL CARE IF:   You develop new bowel or bladder control problems.  You have unusual weakness or numbness in your arms or legs.  You develop nausea or vomiting.  You develop abdominal pain.  You feel faint.   This information is not intended to replace advice given to you by your health care provider. Make sure you  discuss any questions you have with your health care provider.   Document Released: 12/23/2004 Document Revised: 01/13/2014 Document Reviewed: 04/26/2013 Elsevier Interactive Patient Education [employer].

## 2015-02-10 NOTE — ED Notes (Signed)
States has intermittent tingling in left arm

## 2015-02-10 NOTE — ED Provider Notes (Signed)
CSN: [id number]     Arrival date & time 02/10/15  0724 History  By signing my name below, I, [name], attest that this documentation has been prepared under the direction and in the presence of Glynn Octave, MD. Electronically Signed: [name], Neurosurgeon. 02/10/2015. 7:52 AM.    Chief Complaint  Patient presents with  . Back Pain   The history is provided by the patient. No language interpreter was used.     HPI Comments: Natasha Love is a 35 y.o. female with past medical history of who presents to the Emergency Department complaining of constant, gradually worsened, burning left upper back pain for the past 3 months. She denies known injuries or trauma to the affected area. She states standing for long periods of time aggravates her pain; nothing alleviates her symptoms. She reports associated left arm paresthesias that are intermittent. She has chronic lower back pain but states this is different. She took an old Flexeril without relief. She denies fever, vomiting, bowel/bladder incontinence, abdominal pain. She is right hand dominant. She denies history of back surgeries or CA. She denies history of IV drug use.  PCP Boneta Lucks  Past Medical History  Diagnosis Date  . Diabetes mellitus without complication (HCC)   . Hypertension   . Constipation   . Thyroid disease   . HSV-2 infection   . PCOS (polycystic ovarian syndrome)    History reviewed. No pertinent past surgical history. Family History  Problem Relation Age of Onset  . Diabetes Mother   . Hypertension Mother    Social History  Substance Use Topics  . Smoking status: Never Smoker   . Smokeless tobacco: Never Used  . Alcohol Use: No   OB History    Gravida Para Term Preterm AB TAB SAB Ectopic Multiple Living   0 0 0 0 0 0 0 0 0 0      Review of Systems  A complete 10 system review of systems was obtained and all systems are negative except as noted in the HPI and PMH.    Allergies  Review of  patient's allergies indicates no known allergies.  Home Medications   Prior to Admission medications   Medication Sig Start Date End Date Taking? Authorizing Provider  glipiZIDE (GLUCOTROL) 5 MG tablet Take by mouth daily before breakfast.   Yes Historical Provider, MD  levothyroxine (SYNTHROID, LEVOTHROID) 75 MCG tablet Take 75 mcg by mouth daily before breakfast.   Yes Historical Provider, MD  lisinopril (PRINIVIL,ZESTRIL) 10 MG tablet Take 10 mg by mouth daily.   Yes Historical Provider, MD  omeprazole (PRILOSEC) 20 MG capsule Take 20 mg by mouth daily.   Yes Historical Provider, MD  cephALEXin (KEFLEX) 500 MG capsule Take 1 capsule (500 mg total) by mouth 4 (four) times daily. 04/06/14   Ok Edwards, MD  Flibanserin (ADDYI) 100 MG TABS Take 100 mg by mouth at bedtime. Patient not taking: Reported on 04/06/2014 11/08/13   Ok Edwards, MD  fluconazole (DIFLUCAN) 150 MG tablet Take 1 tablet (150 mg total) by mouth once. 04/06/14   Ok Edwards, MD  levonorgestrel-ethinyl estradiol (AVIANE,ALESSE,LESSINA) 0.1-20 MG-MCG tablet Take 1 tablet by mouth daily. Patient not taking: Reported on 04/06/2014 11/21/13   Ok Edwards, MD  medroxyPROGESTERone (PROVERA) 10 MG tablet Take one tablet for 10 days every 35 days if no spontaneous menses and a negative pregnancy test Patient not taking: Reported on 04/06/2014 11/08/13   Ok Edwards, MD  methocarbamol (ROBAXIN)  500 MG tablet Take 2 tablets (1,000 mg total) by mouth 3 (three) times daily. 02/10/15   Glynn Octave, MD  naproxen (NAPROSYN) 500 MG tablet Take 1 tablet (500 mg total) by mouth 2 (two) times daily. 02/10/15   Glynn Octave, MD   BP 148/98 mmHg  Pulse 80  Temp(Src) 97.7 F (36.5 C) (Oral)  Resp 16  Ht 5' (1.524 m)  Wt 172 lb (78.019 kg)  BMI 33.59 kg/m2  SpO2 99%  LMP 02/07/2015 (Exact Date) Physical Exam  Constitutional: She is oriented to person, place, and time. She appears well-developed and well-nourished. No  distress.  HENT:  Head: Normocephalic and atraumatic.  Mouth/Throat: Oropharynx is clear and moist. No oropharyngeal exudate.  Eyes: Conjunctivae and EOM are normal. Pupils are equal, round, and reactive to light.  Neck: Normal range of motion. Neck supple.  No meningismus.  Cardiovascular: Normal rate, regular rhythm, normal heart sounds and intact distal pulses.   No murmur heard. No carotid bruits.   Pulmonary/Chest: Effort normal and breath sounds normal. No respiratory distress.  Abdominal: Soft. There is no tenderness. There is no rebound and no guarding.  Musculoskeletal: Normal range of motion. She exhibits tenderness. She exhibits no edema.  Left paraspinal cervical and thoracic tenderness. Worse with palpation and left arm movement. Equal radial pulses.   Neurological: She is alert and oriented to person, place, and time. No cranial nerve deficit. She exhibits normal muscle tone. Coordination normal.  No ataxia on finger to nose bilaterally. No pronator drift. 5/5 strength throughout. CN 2-12 intact. Equal grip strength. Axillary nerve sensation intact.   Skin: Skin is warm.  Psychiatric: She has a normal mood and affect. Her behavior is normal.  Nursing note and vitals reviewed.   ED Course  Procedures (including critical care time)  DIAGNOSTIC STUDIES: Oxygen Saturation is 99% on RA, normal by my interpretation.    COORDINATION OF CARE: 8:07 AM Will order imaging of the C-spine and chest. Discussed treatment plan with pt at bedside and pt agreed to plan.   Labs Review Labs Reviewed - No data to display  Imaging Review Dg Chest 2 View  02/10/2015  CLINICAL DATA:  Upper back pain for 3 months without known injury. EXAM: CHEST  2 VIEW COMPARISON:  None. FINDINGS: The heart size and mediastinal contours are within normal limits. Both lungs are clear. No pneumothorax or pleural effusion is noted. The visualized skeletal structures are unremarkable. IMPRESSION: No active  cardiopulmonary disease. Electronically Signed   By: Lupita Raider, M.D.   On: 02/10/2015 09:10   Dg Cervical Spine Complete  02/10/2015  CLINICAL DATA:  Left arm paresthesias. EXAM: CERVICAL SPINE - COMPLETE 4+ VIEW COMPARISON:  None. FINDINGS: No fracture or spondylolisthesis is noted. Mild degenerative disc disease is noted at C5-6 with anterior osteophyte formation. No neural foraminal stenosis is noted. Posterior facet joints appear intact. IMPRESSION: Mild degenerative disc disease is noted at C5-6. Electronically Signed   By: Lupita Raider, M.D.   On: 02/10/2015 09:12      EKG Interpretation None      MDM   Final diagnoses:  Upper back pain   3 months of left-sided neck and upper back pain without injury. No focal weakness. Intermittent tingling in left arm but not at this time.  Equal grip strengths and radial pulses. No motor or sensory deficits.  Patient reassured there is no evidence of serious spinal cord injury. No evidence of cord compression or cauda equina.  We'll prescribe NSAIDs, muscle relaxers, follow-up with PCP and sports medicine. Return precautions discussed.  I personally performed the services described in this documentation, which was scribed in my presence. The recorded information has been reviewed and is accurate.   Glynn Octave, MD 02/10/15 [phone]
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
## 2015-02-10 NOTE — ED Notes (Signed)
MD at bedside. 

## 2015-03-02 ENCOUNTER — Emergency Department (HOSPITAL_BASED_OUTPATIENT_CLINIC_OR_DEPARTMENT_OTHER)
Admission: EM | Admit: 2015-03-02 | Discharge: 2015-03-03 | Disposition: A | Discharge status: Home / Self Care | Payer: 59 | Attending: Emergency Medicine | Admitting: Emergency Medicine

## 2015-03-02 ENCOUNTER — Encounter (HOSPITAL_BASED_OUTPATIENT_CLINIC_OR_DEPARTMENT_OTHER): Payer: Self-pay | Admitting: Emergency Medicine

## 2015-03-02 DIAGNOSIS — K0889 Other specified disorders of teeth and supporting structures: Secondary | ICD-10-CM | POA: Diagnosis not present

## 2015-03-02 DIAGNOSIS — E119 Type 2 diabetes mellitus without complications: Secondary | ICD-10-CM | POA: Diagnosis not present

## 2015-03-02 DIAGNOSIS — R519 Headache, unspecified: Secondary | ICD-10-CM

## 2015-03-02 DIAGNOSIS — Z7984 Long term (current) use of oral hypoglycemic drugs: Secondary | ICD-10-CM | POA: Insufficient documentation

## 2015-03-02 DIAGNOSIS — I10 Essential (primary) hypertension: Secondary | ICD-10-CM | POA: Insufficient documentation

## 2015-03-02 DIAGNOSIS — Z79899 Other long term (current) drug therapy: Secondary | ICD-10-CM | POA: Insufficient documentation

## 2015-03-02 DIAGNOSIS — R51 Headache: Secondary | ICD-10-CM | POA: Insufficient documentation

## 2015-03-02 DIAGNOSIS — R112 Nausea with vomiting, unspecified: Secondary | ICD-10-CM | POA: Diagnosis not present

## 2015-03-02 DIAGNOSIS — Z792 Long term (current) use of antibiotics: Secondary | ICD-10-CM | POA: Insufficient documentation

## 2015-03-02 DIAGNOSIS — E079 Disorder of thyroid, unspecified: Secondary | ICD-10-CM | POA: Insufficient documentation

## 2015-03-02 DIAGNOSIS — K047 Periapical abscess without sinus: Secondary | ICD-10-CM | POA: Diagnosis not present

## 2015-03-02 DIAGNOSIS — Z8619 Personal history of other infectious and parasitic diseases: Secondary | ICD-10-CM | POA: Diagnosis not present

## 2015-03-02 MED ORDER — ONDANSETRON 8 MG PO TBDP: 8.0000 mg | ORAL_TABLET | Freq: Once | ORAL | Status: DC

## 2015-03-02 MED ORDER — ONDANSETRON HCL 4 MG/2ML IJ SOLN
4.0000 mg | Freq: Once | INTRAMUSCULAR | Status: AC
  Administered 2015-03-02: 4 mg via INTRAVENOUS
  Filled 2015-03-02: qty 2

## 2015-03-02 MED ORDER — KETOROLAC TROMETHAMINE 30 MG/ML IJ SOLN: 30.0000 mg | Freq: Once | INTRAMUSCULAR | Status: DC

## 2015-03-02 MED ORDER — KETOROLAC TROMETHAMINE 30 MG/ML IJ SOLN
30.0000 mg | Freq: Once | INTRAMUSCULAR | Status: AC
  Administered 2015-03-02: 30 mg via INTRAVENOUS
  Filled 2015-03-02: qty 1

## 2015-03-02 NOTE — ED Notes (Signed)
Had teeth cleaned today and had abscess behind tooth. Pt states started having headache and nausea and vomiting since.

## 2015-03-02 NOTE — ED Provider Notes (Signed)
CSN: [id number]     Arrival date & time 03/02/15  2050 History  By signing my name below, I, [name], attest that this documentation has been prepared under the direction and in the presence of [employer], PA-C. Electronically Signed: [name], ED Scribe. 03/02/2015. 10:54 PM.     Chief Complaint  Patient presents with  . Migraine      The history is provided by the patient. No language interpreter was used.   HPI Comments: Natasha Love is a 35 y.o. female who presents to the Emergency Department complaining of constant, gradual worsening, frontal forehead, moderate, 6\10, pressure-like migraine with associated nausea and vomiting onset this afternoon. Similar to previous headaches. Pt states she got her teeth deep cleaned at the dentist this morning was was given nitrous oxide and Novocain shots. Pt notes she feels as if she is still on the nitrous. She reports having an abscess behind her tooth. She received some penicillin from the dentist and states she was not able to keep them down. She took half of a hydrocodone to try and alleviate her symptoms but she was not able to keep them down. Denies fevers, numbness, and weakness.  Past Medical History  Diagnosis Date  . Diabetes mellitus without complication (HCC)   . Hypertension   . Constipation   . Thyroid disease   . HSV-2 infection   . PCOS (polycystic ovarian syndrome)    History reviewed. No pertinent past surgical history. Family History  Problem Relation Age of Onset  . Diabetes Mother   . Hypertension Mother    Social History  Substance Use Topics  . Smoking status: Never Smoker   . Smokeless tobacco: Never Used  . Alcohol Use: No   OB History    Gravida Para Term Preterm AB TAB SAB Ectopic Multiple Living       Review of Systems  Constitutional: Negative for fever.  HENT: Positive for dental problem.   Gastrointestinal: Positive for nausea and vomiting.  Neurological:  Positive for headaches. Negative for weakness and numbness.  All other systems reviewed and are negative.     Allergies  Review of patient's allergies indicates no known allergies.  Home Medications   Prior to Admission medications   Medication Sig Start Date End Date Taking? Authorizing Provider  cephALEXin (KEFLEX) 500 MG capsule Take 1 capsule (500 mg total) by mouth 4 (four) times daily. 04/06/14   Ok Edwards, MD  Flibanserin (ADDYI) 100 MG TABS Take 100 mg by mouth at bedtime. Patient not taking: Reported on 04/06/2014 11/08/13   Ok Edwards, MD  fluconazole (DIFLUCAN) 150 MG tablet Take 1 tablet (150 mg total) by mouth once. 04/06/14   Ok Edwards, MD  glipiZIDE (GLUCOTROL) 5 MG tablet Take by mouth daily before breakfast.    Historical Provider, MD  levonorgestrel-ethinyl estradiol (AVIANE,ALESSE,LESSINA) 0.1-20 MG-MCG tablet Take 1 tablet by mouth daily. Patient not taking: Reported on 04/06/2014 11/21/13   Ok Edwards, MD  levothyroxine (SYNTHROID, LEVOTHROID) 75 MCG tablet Take 75 mcg by mouth daily before breakfast.    Historical Provider, MD  lisinopril (PRINIVIL,ZESTRIL) 10 MG tablet Take 10 mg by mouth daily.    Historical Provider, MD  medroxyPROGESTERone (PROVERA) 10 MG tablet Take one tablet for 10 days every 35 days if no spontaneous menses and a negative pregnancy test Patient not taking: Reported on 04/06/2014 11/08/13   Ok Edwards, MD  methocarbamol (ROBAXIN) 500 MG tablet Take 2 tablets (1,000 mg total) by mouth 3 (three) times daily. 02/10/15   Glynn Octave, MD  naproxen (NAPROSYN) 500 MG tablet Take 1 tablet (500 mg total) by mouth 2 (two) times daily. 02/10/15   Glynn Octave, MD  omeprazole (PRILOSEC) 20 MG capsule Take 20 mg by mouth daily.    Historical Provider, MD  ondansetron (ZOFRAN) 4 MG tablet Take 1 tablet (4 mg total) by mouth every 6 (six) hours. 03/03/15   Joycie Peek, PA-C   Triage vitals: BP 174/113 mmHg  Pulse 91   Temp(Src) 98.7 F (37.1 C) (Oral)  Resp 18  Ht 5' (1.524 m)  Wt 172 lb (78.019 kg)  BMI 33.59 kg/m2  SpO2 100%  LMP 02/07/2015 (Exact Date) Physical Exam  Constitutional: She is oriented to person, place, and time. She appears well-developed and well-nourished. No distress.  HENT:  Head: Normocephalic and atraumatic.  Mouth/Throat: Oropharynx is clear and moist.  Some missing teeth, oropharynx otherwise unremarkable. No drainable abscess. No trismus, glossal elevation or other evidence of deep space infection  Eyes: Conjunctivae and EOM are normal. Pupils are equal, round, and reactive to light.  Neck: Normal range of motion.  No meningismus or nuchal rigidity  Cardiovascular: Normal rate, regular rhythm and normal heart sounds.   Pulmonary/Chest: Effort normal and breath sounds normal.  Abdominal: Soft. She exhibits no distension. There is no tenderness.  Musculoskeletal: Normal range of motion. She exhibits no edema or tenderness.  Neurological: She is alert and oriented to person, place, and time.  Cranial nerves III through XII grossly intact. Motor strength is 5/5 in all 4 extremities and sensation is intact to light touch. Completes finger to nose coordination movements without difficulty. No nystagmus. Gait is baseline without ataxia.  Skin: She is not diaphoretic.  Psychiatric: She has a normal mood and affect.  Nursing note and vitals reviewed.   ED Course  Procedures  DIAGNOSTIC STUDIES: Oxygen Saturation is 100% on RA, normal by my interpretation.  COORDINATION OF CARE:  10:52 PM Discussed treatment plan which includes nausea medication and follow up with dentist with pt at bedside and pt agreed to plan.  Labs Review Labs Reviewed - No data to display  Imaging Review No results found. I have personally reviewed and evaluated these images and lab results as part of my medical decision-making.   EKG Interpretation None     Meds given in ED:  Medications   ondansetron [facility]) injection 4 mg (4 mg Intravenous Given 03/02/15 2153)  ketorolac (TORADOL) 30 MG/ML injection 30 mg (30 mg Intravenous Given 03/02/15 2339)  ondansetron (ZOFRAN) injection 4 mg (4 mg Intravenous Given 03/02/15 2340)    New Prescriptions   ONDANSETRON (ZOFRAN) 4 MG TABLET    Take 1 tablet (4 mg total) by mouth every 6 (six) hours.   Filed Vitals:   03/02/15 2058 03/02/15 2349  BP: 174/113 137/97  Pulse: 91 77  Temp: 98.7 F (37.1 C)   TempSrc: Oral   Resp: 18 18  Height: 5' (1.524 m)   Weight: 78.019 kg   SpO2: 100% 97%    MDM  Pt HA treated and improved while in ED.  Presentation is like pts typical HA and non concerning for [facility], ICH, Meningitis, or temporal arteritis. Pt is afebrile with no focal neuro deficits, nuchal rigidity, or change in vision. No nausea or vomiting in the ED. Symptoms resolved with Toradol and Zofran. Pt is to follow up with PCP  to discuss prophylactic medication. Pt verbalizes understanding and is agreeable with plan to dc.   Final diagnoses:  Nonintractable headache, unspecified chronicity pattern, unspecified headache type    I personally performed the services described in this documentation, which was scribed in my presence. The recorded information has been reviewed and is accurate.   Joycie Peek, PA-C 03/03/15 1610  Marily Memos, MD 03/03/15 1254

## 2015-03-02 NOTE — ED Notes (Signed)
Had dental work done this am  Now c/o ha, n/v and generalized body aches,  (feels like skin numb,  States feels like still on nitrous oxidize)

## 2015-03-03 MED ORDER — ONDANSETRON HCL 4 MG PO TABS: 4.0000 mg | ORAL_TABLET | Freq: Four times a day (QID) | ORAL | Status: DC

## 2015-03-03 NOTE — Discharge Instructions (Signed)
You may continue taking Aleve for your headache as we discussed. You may take your nausea medicine as needed. Follow-up with your doctor next week. Return to ED for new or worsening symptoms.  General Headache Without Cause A headache is pain or discomfort felt around the head or neck area. There are many causes and types of headaches. In some cases, the cause may not be found.  HOME CARE  Managing Pain  Take over-the-counter and prescription medicines only as told by your doctor.  Lie down in a dark, quiet room when you have a headache.  If directed, apply ice to the head and neck area:  Put ice in a plastic bag.  Place a towel between your skin and the bag.  Leave the ice on for 20 minutes, 2-3 times per day.  Use a heating pad or hot shower to apply heat to the head and neck area as told by your doctor.  Keep lights dim if bright lights bother you or make your headaches worse. Eating and Drinking  Eat meals on a regular schedule.  Lessen how much alcohol you drink.  Lessen how much caffeine you drink, or stop drinking caffeine. General Instructions  Keep all follow-up visits as told by your doctor. This is important.  Keep a journal to find out if certain things bring on headaches. For example, write down:  What you eat and drink.  How much sleep you get.  Any change to your diet or medicines.  Relax by getting a massage or doing other relaxing activities.  Lessen stress.  Sit up straight. Do not tighten (tense) your muscles.  Do not use tobacco products. This includes cigarettes, chewing tobacco, or e-cigarettes. If you need help quitting, ask your doctor.  Exercise regularly as told by your doctor.  Get enough sleep. This often means 7-9 hours of sleep. GET HELP IF:  Your symptoms are not helped by medicine.  You have a headache that feels different than the other headaches.  You feel sick to your stomach (nauseous) or you throw up (vomit).  You have a  fever. GET HELP RIGHT AWAY IF:   Your headache becomes really bad.  You keep throwing up.  You have a stiff neck.  You have trouble seeing.  You have trouble speaking.  You have pain in the eye or ear.  Your muscles are weak or you lose muscle control.  You lose your balance or have trouble walking.  You feel like you will pass out (faint) or you pass out.  You have confusion.   This information is not intended to replace advice given to you by your health care provider. Make sure you discuss any questions you have with your health care provider.   Document Released: 10/02/2007 Document Revised: 09/13/2014 Document Reviewed: 04/17/2014 Elsevier Interactive Patient Education [employer]

## 2015-08-13 ENCOUNTER — Other Ambulatory Visit: Payer: Self-pay | Admitting: Gynecology

## 2015-08-13 ENCOUNTER — Telehealth: Payer: Self-pay | Admitting: *Deleted

## 2015-08-13 MED ORDER — VALACYCLOVIR HCL 500 MG PO TABS: 500.0000 mg | ORAL_TABLET | Freq: Three times a day (TID) | ORAL | 5 refills | Status: AC

## 2015-08-13 NOTE — Telephone Encounter (Signed)
Per note on Nov. 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 Please advise

## 2015-09-05 ENCOUNTER — Encounter: Payer: 59 | Admitting: Gynecology

## 2015-09-27 ENCOUNTER — Encounter: Payer: 59 | Admitting: Gynecology

## 2015-09-27 DIAGNOSIS — Z0289 Encounter for other administrative examinations: Secondary | ICD-10-CM

## 2016-05-21 ENCOUNTER — Encounter: Payer: Self-pay | Admitting: Gynecology

## 2016-06-20 ENCOUNTER — Encounter: Payer: Self-pay | Admitting: Gynecology

## 2016-06-20 ENCOUNTER — Ambulatory Visit (INDEPENDENT_AMBULATORY_CARE_PROVIDER_SITE_OTHER): Payer: 59 | Admitting: Gynecology

## 2016-06-20 VITALS — BP 132/80 | Ht 61.0 in | Wt 170.0 lb

## 2016-06-20 DIAGNOSIS — Z01419 Encounter for gynecological examination (general) (routine) without abnormal findings: Secondary | ICD-10-CM

## 2016-06-20 DIAGNOSIS — E282 Polycystic ovarian syndrome: Secondary | ICD-10-CM

## 2016-06-20 DIAGNOSIS — N914 Secondary oligomenorrhea: Secondary | ICD-10-CM | POA: Diagnosis not present

## 2016-06-20 NOTE — Progress Notes (Signed)
Natasha Love [birth year] [id number]   History:    36 y.o.  for annual gyn exam who has not been seen the office since 2015. Patient with past history of PCOS. Patient sometimes go several months without a menstrual cycle. Her medical doctor has been treating her for type 2 diabetes and hypothyroidism. Patient has not been using any form of contraception. Patient with no previous history of any abnormal Pap smears.  Past medical history,surgical history, family history and social history were all reviewed and documented in the EPIC chart.  Gynecologic History Patient's last menstrual period was 05/30/2016 (approximate). Contraception: none Last Pap: 2015. Results were: normal Last mammogram: Not indicated. Results were: normal  Obstetric History OB History  Gravida Para Term Preterm AB Living  0 0 0 0 0 0  SAB TAB Ectopic Multiple Live Births  0 0 0 0           ROS: A ROS was performed and pertinent positives and negatives are included in the history.  GENERAL: No fevers or chills. HEENT: No change in vision, no earache, sore throat or sinus congestion. NECK: No pain or stiffness. CARDIOVASCULAR: No chest pain or pressure. No palpitations. PULMONARY: No shortness of breath, cough or wheeze. GASTROINTESTINAL: No abdominal pain, nausea, vomiting or diarrhea, melena or bright red blood per rectum. GENITOURINARY: No urinary frequency, urgency, hesitancy or dysuria. MUSCULOSKELETAL: No joint or muscle pain, no back pain, no recent trauma. DERMATOLOGIC: No rash, no itching, no lesions. ENDOCRINE: No polyuria, polydipsia, no heat or cold intolerance. No recent change in weight. HEMATOLOGICAL: No anemia or easy bruising or bleeding. NEUROLOGIC: No headache, seizures, numbness, tingling or weakness. PSYCHIATRIC: No depression, no loss of interest in normal activity or change in sleep pattern.     Exam: chaperone present  BP 132/80   Ht 5\' 1"  (1.549 m)   Wt 170 lb (77.1 kg)   LMP  05/30/2016 (Approximate)   BMI 32.12 kg/m   Body mass index is 32.12 kg/m.  General appearance : Well developed well nourished female. No acute distress HEENT: Eyes: no retinal hemorrhage or exudates,  Neck supple, trachea midline, no carotid bruits, no thyroidmegaly Lungs: Clear to auscultation, no rhonchi or wheezes, or rib retractions  Heart: Regular rate and rhythm, no murmurs or gallops Breast:Examined in sitting and supine position were symmetrical in appearance, no palpable masses or tenderness,  no skin retraction, no nipple inversion, no nipple discharge, no skin discoloration, no axillary or supraclavicular lymphadenopathy Abdomen: no palpable masses or tenderness, no rebound or guarding Extremities: no edema or skin discoloration or tenderness  Pelvic:  Bartholin, Urethra, Skene Glands: Within normal limits             Vagina: No gross lesions or discharge  Cervix: No gross lesions or discharge  Uterus  slightly retroverted, normal size, shape and consistency, non-tender and mobile  Adnexa  Without masses or tenderness  Anus and perineum  normal   Rectovaginal  normal sphincter tone without palpated masses or tenderness             Hemoccult not indicated     Assessment/Plan:  36 y.o. female for annual exam was offered different forms of contraception and is interested in proceeding with a Mirena IUD at the start of her next cycle again of the month. I've given her prescription for Provera 10 mg to take 1 by mouth daily for 10 days if she does not have a spontaneous menses at first to  check home pregnancy test. Also she will call the office when her cycle start so we can place a Mirena IUD. Literature information was provided on the Mirena IUD. Pap smear with HPV screening done today.   Ok Edwards MD, 4:40 PM 06/20/2016

## 2016-06-20 NOTE — Patient Instructions (Signed)

## 2016-06-24 LAB — PAP, TP IMAGING W/ HPV RNA, RFLX HPV TYPE 16,18/45: HPV mRNA, High Risk: NOT DETECTED

## 2017-02-14 IMAGING — CR DG CHEST 2V
2 series · 2 of 2 positions shown · non-contrast
Comparison: None.

CLINICAL DATA: Upper back pain for 3 months without known injury.

EXAM:
CHEST  2 VIEW

[w chest pa]
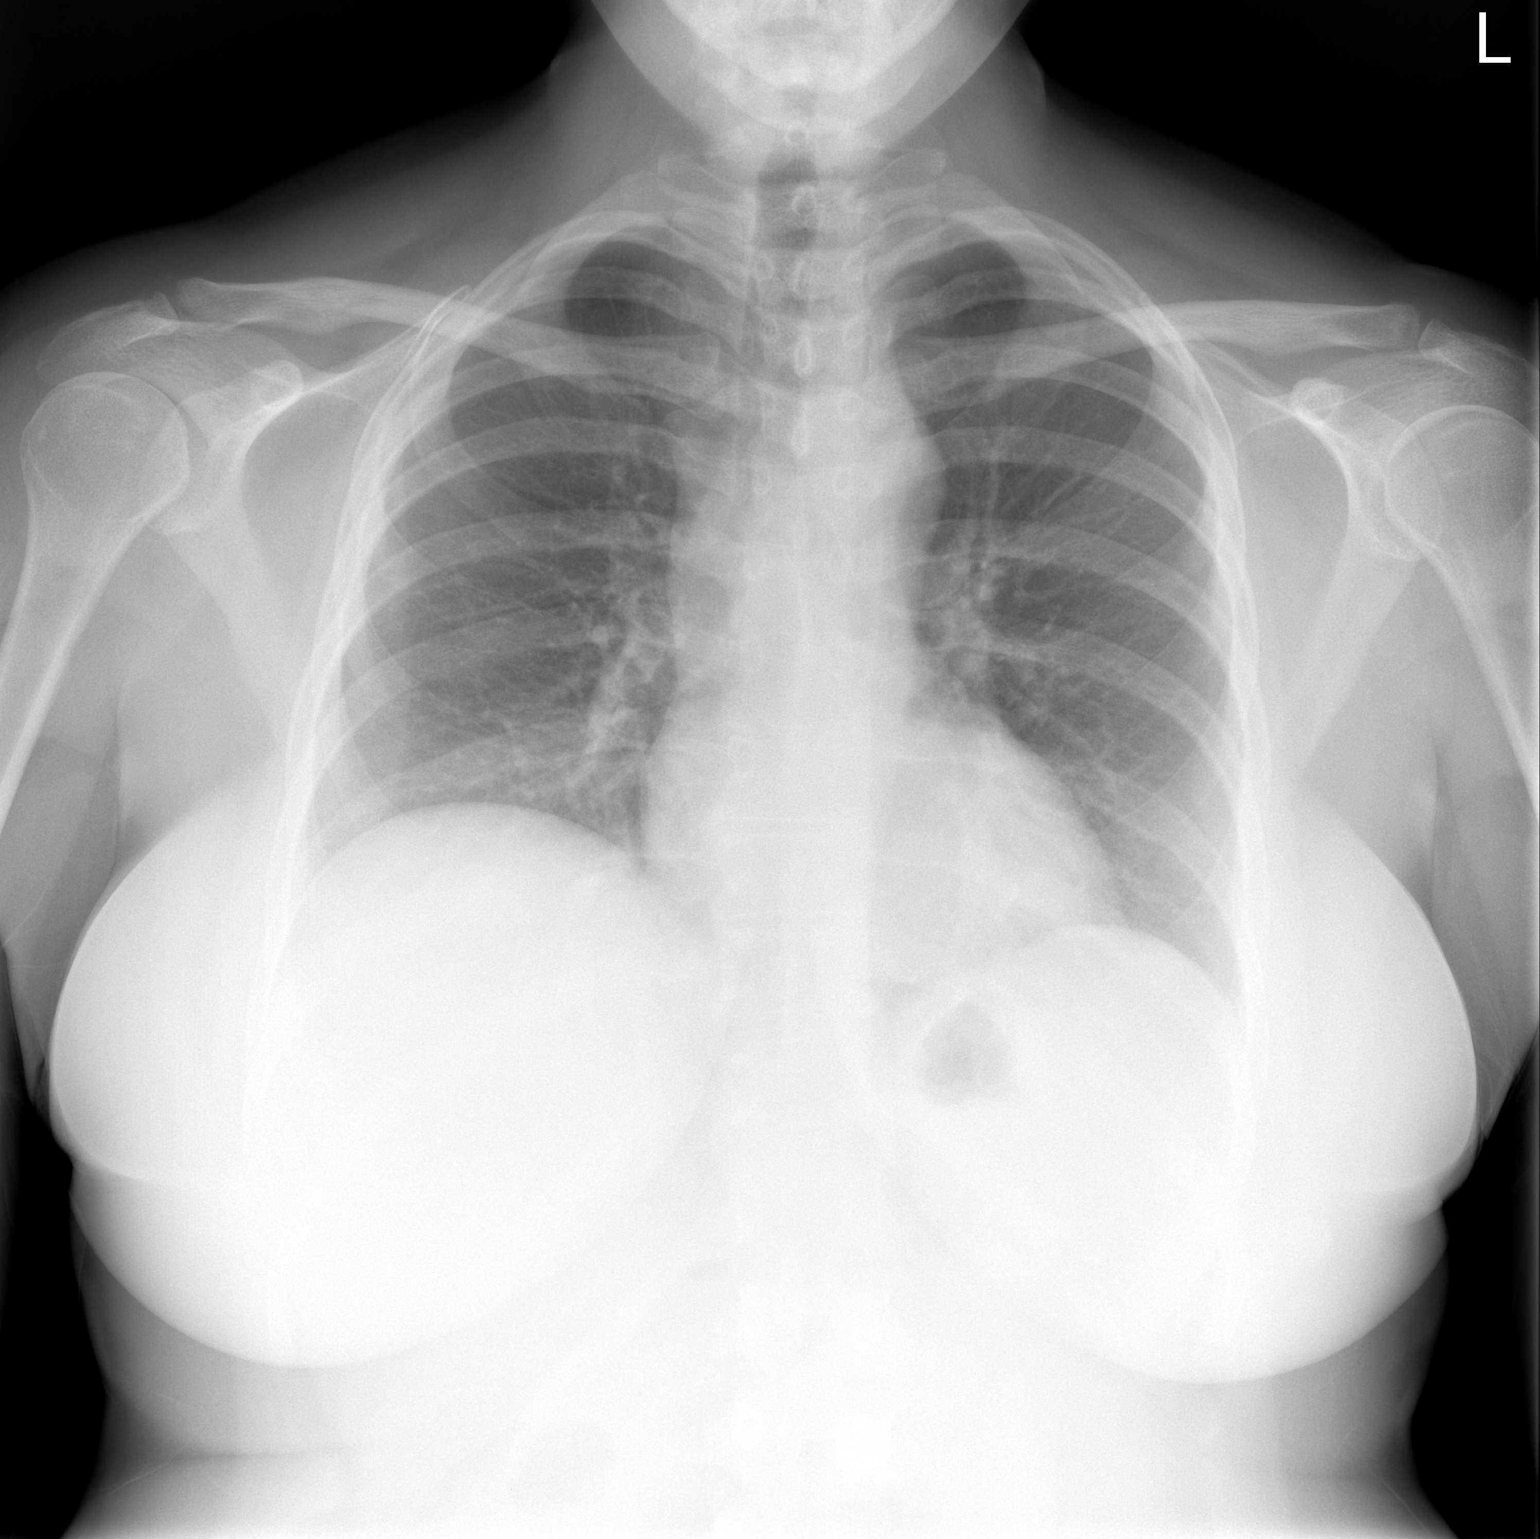

[w chest lat]
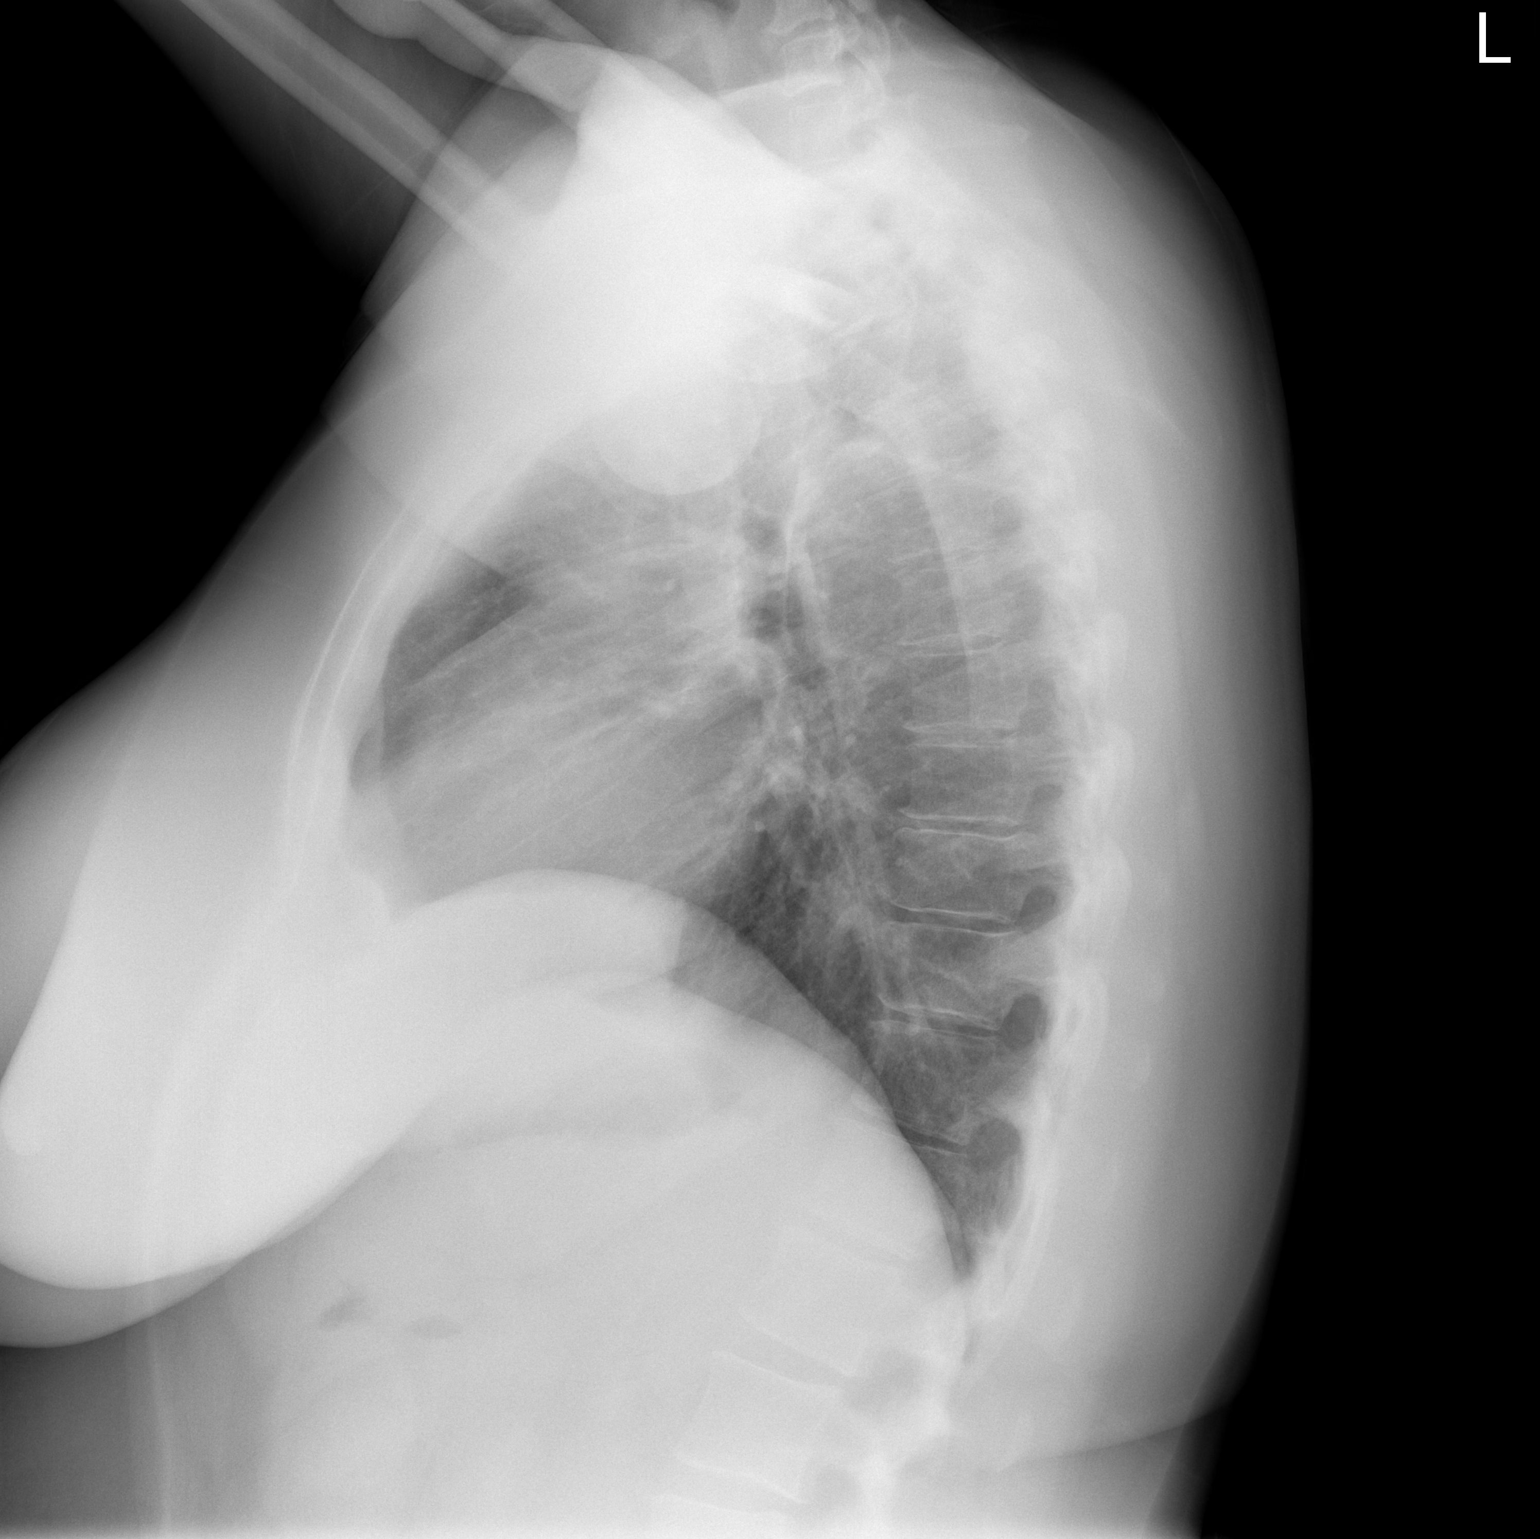

[2 of 2 positions shown; findings below may reference images not displayed]

FINDINGS: The heart size and mediastinal contours are within normal limits.
Both lungs are clear. No pneumothorax or pleural effusion is noted.
The visualized skeletal structures are unremarkable.
IMPRESSION: No active cardiopulmonary disease.

## 2017-02-14 IMAGING — CR DG CERVICAL SPINE COMPLETE 4+V
7 series · 7 of 7 positions shown · non-contrast
Comparison: None.

CLINICAL DATA: Left arm paresthesias.

EXAM:
CERVICAL SPINE - COMPLETE 4+ VIEW

[w c-spine lat]
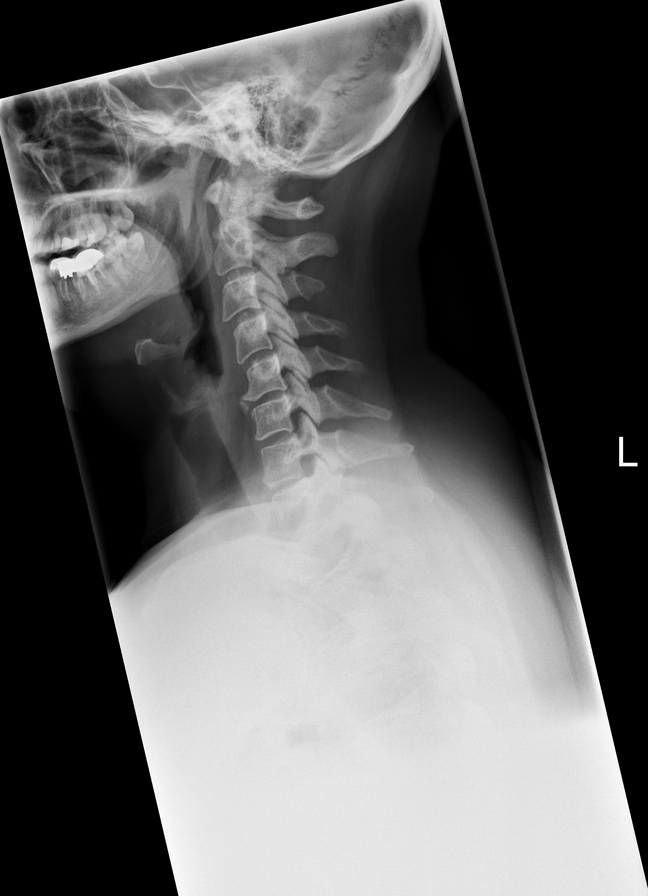

[w c-spine oblique (1 of 2)]
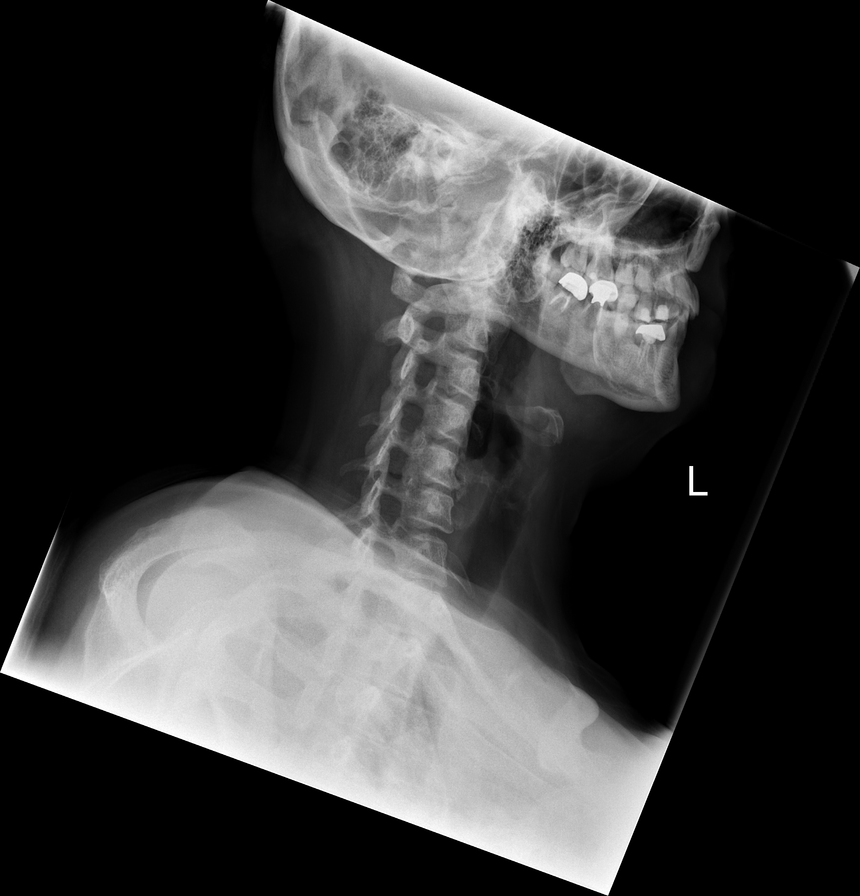

[w c-spine oblique (2 of 2)]
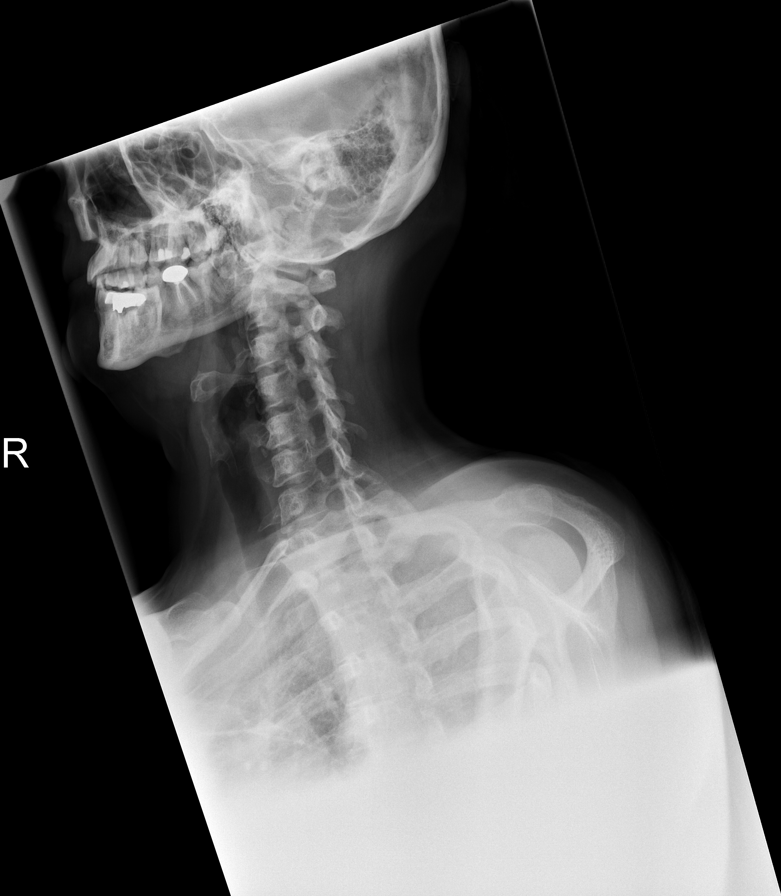

[w c-spine a.p.]
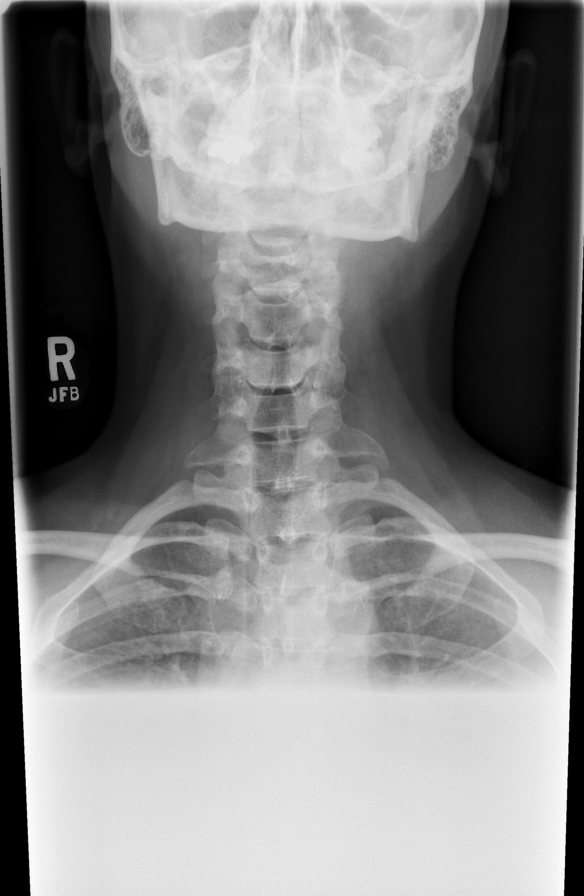

[w c-spine odontoid (1 of 2)]
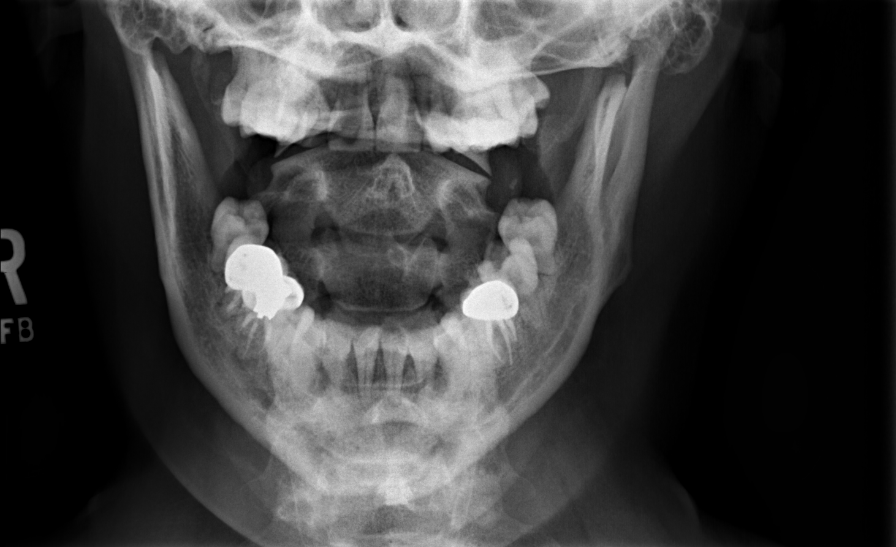

[w c-spine odontoid (2 of 2)]
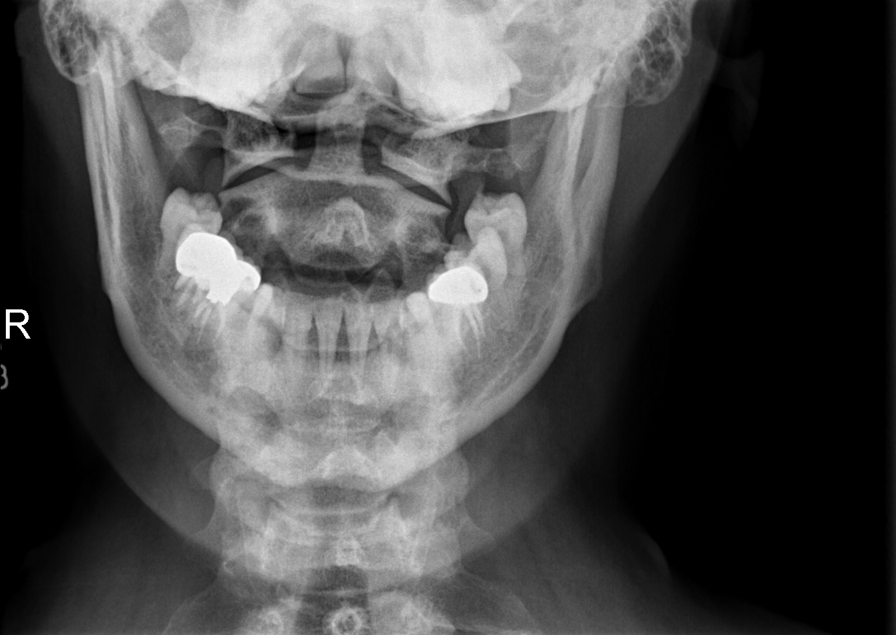

[w swimmers view]
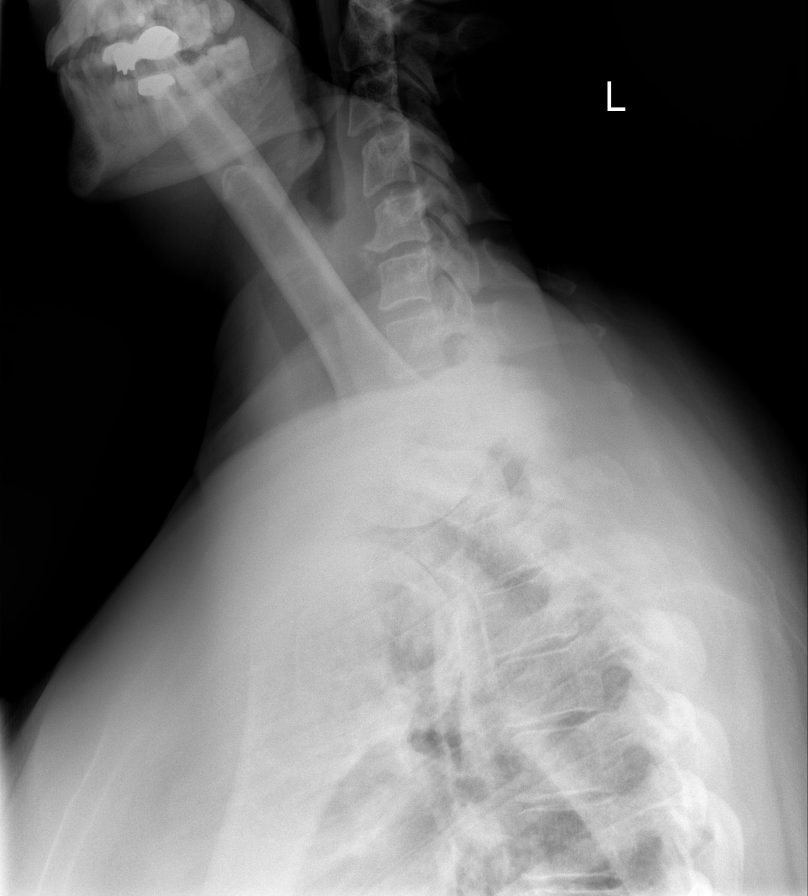

[7 of 7 positions shown; findings below may reference images not displayed]

FINDINGS: No fracture or spondylolisthesis is noted. Mild degenerative disc
disease is noted at C5-6 with anterior osteophyte formation. No
neural foraminal stenosis is noted. Posterior facet joints appear
intact.
IMPRESSION: Mild degenerative disc disease is noted at C5-6.

## 2022-12-11 ENCOUNTER — Encounter: Payer: 59 | Admitting: Internal Medicine

## 2022-12-11 ENCOUNTER — Encounter: Payer: Self-pay | Admitting: Internal Medicine

## 2022-12-11 VITALS — BP 138/86 | HR 79 | Ht 61.0 in | Wt 168.0 lb

## 2022-12-11 NOTE — Progress Notes (Deleted)
Name: Natasha Love  MRN/ DOB: [id number], 1980-05-18   Age/ Sex: 42 y.o., female    PCP: Patient, No Pcp Per   Reason for Endocrinology Evaluation: Type 2 Diabetes Mellitus     Date of Initial Endocrinology Visit: 12/11/2022     PATIENT IDENTIFIER: Natasha Love is a 42 y.o. female with a past medical history of DM, PCOS, hypothyroid. The patient presented for initial endocrinology clinic visit on 12/11/2022 for consultative assistance with her diabetes management.    HPI: Natasha Love was    Diagnosed with DM age 45 Prior Medications tried/Intolerance: Metformin-GI side effects.  Victoza -GI side effects, Bydureon-excessive bruising.  Ozempic-blurry vision/headaches, Jardiance- Currently checking blood sugars occasionally   Hemoglobin A1c has ranged from 9.1% in 2021, peaking at 13.7% in 2023.  I   Patient states that I am her fourth endocrinologist, patient states she has been on so many medications in the past and feels that her body is working against her and has not found the right combination.  She does not eat consistent meals, I was unable to figure out if she grazes throughout the day she does drink water and Kool-Aid with 1/4 cup of sugar  She has attempted lifestyle changes for a year and a half without changes  She has been on multiple agents with side effects, I did offer her prandial insulin and a referral to CDE while she works on lifestyle changes but she does not want to be on injections and wants to get off injections unfortunately patient with limited options due to history of multiple intolerance to medications  HOME DIABETES REGIMEN: Glimepiride 4 mg daily Lantus 50 units daily  Statin: Atorvastatin 20 mg ACE-I/ARB: Yes   METER DOWNLOAD SUMMARY: N/A    DIABETIC COMPLICATIONS: Microvascular complications:   Denies: Neuropathy, CKD, retinopathy Last eye exam: Completed   Macrovascular complications:   Denies: CAD, PVD, CVA   PAST  HISTORY: Past Medical History:  Past Medical History:  Diagnosis Date   Constipation    Diabetes mellitus without complication (HCC)    HSV-2 infection    Hypertension    PCOS (polycystic ovarian syndrome)    Thyroid disease    Past Surgical History: No past surgical history on file.  Social History:  reports that she has never smoked. She has never used smokeless tobacco. She reports that she does not drink alcohol and does not use drugs. Family History:  Family History  Problem Relation Age of Onset   Diabetes Mother    Hypertension Mother      HOME MEDICATIONS: Allergies as of 12/11/2022   No Known Allergies      Medication List        Accurate as of December 11, 2022  7:03 AM. If you have any questions, ask your nurse or doctor.          glipiZIDE 5 MG tablet Commonly known as: GLUCOTROL Take by mouth daily before breakfast.   Jardiance 25 MG Tabs tablet Generic drug: empagliflozin Take 25 mg by mouth daily.   levothyroxine 100 MCG tablet Commonly known as: SYNTHROID Take 100 mcg by mouth daily before breakfast.   lisinopril 10 MG tablet Commonly known as: ZESTRIL Take 10 mg by mouth daily.   naproxen 500 MG tablet Commonly known as: NAPROSYN Take 1 tablet (500 mg total) by mouth 2 (two) times daily.   omeprazole 20 MG capsule Commonly known as: PRILOSEC Take 20 mg by mouth daily.   valACYclovir 500  MG tablet Commonly known as: VALTREX Take 1 tablet (500 mg total) by mouth 3 (three) times daily.         ALLERGIES: No Known Allergies   REVIEW OF SYSTEMS: A comprehensive ROS was conducted with the patient and is negative except as per HPI and below:  ROS    OBJECTIVE:   VITAL SIGNS: There were no vitals taken for this visit.   PHYSICAL EXAM:  General: Pt appears well and is in NAD  Neck: General: Supple without adenopathy or carotid bruits. Thyroid: Thyroid size normal.  No goiter or nodules appreciated.   Lungs: Clear with good  BS bilat   Heart: RRR   Abdomen:  soft, nontender  Extremities:  Lower extremities - No pretibial edema.   Neuro: MS is good with appropriate affect, pt is alert and Ox3    DM foot exam:    DATA REVIEWED:     ASSESSMENT / PLAN / RECOMMENDATIONS:   1) Type 2 Diabetes Mellitus, poorly controlled, Without complications - Most recent A1c of 10.3 %. Goal A1c < 7.0 %.    Plan: GENERAL: ***  MEDICATIONS: ***  EDUCATION / INSTRUCTIONS: BG monitoring instructions: Patient is instructed to check her blood sugars *** times a day, ***. Call [facility] if: BG persistently < 70  I reviewed the Rule of 15 for the treatment of hypoglycemia in detail with the patient. Literature supplied.   2) Diabetic complications:  Eye: Does not have known diabetic retinopathy.  Neuro/ Feet: Does not have known diabetic peripheral neuropathy. Renal: Patient does not have known baseline CKD. She is  on an ACEI/ARB at present.  3) Lipids: Patient is *** on a statin.       Signed electronically by: Lyndle Herrlich, MD  [facility]  [facility] [address] [place], [place] [address] Phone: [phone] FAX: [phone]   CC: Patient, No Pcp Per No address on file Phone: None  Fax: None    Return to Endocrinology clinic as below: Future Appointments  Date Time Provider Department Center  12/11/2022  8:30 AM Juleen Sorrels, Konrad Dolores, MD [id number] None

## 2022-12-11 NOTE — Progress Notes (Signed)
Order(s) created erroneously. Erroneous order ID: [id number]  Order moved by: CHART CORRECTION ANALYST SEVEN, IDENTITY  Order move date/time: 12/11/2022 1:07 PM  Source Patient: [id number]  Source Contact: 12/11/2022  Destination Patient: [id number]  Destination Contact: 11/08/2020  Erroneous order ID: [id number]  Order moved by: CHART CORRECTION ANALYST SEVEN, IDENTITY  Order move date/time: 12/11/2022 1:07 PM  Source Patient: [id number]  Source Contact: 12/11/2022  Destination Patient: [id number]  Destination Contact: 11/08/2020

## 2022-12-18 ENCOUNTER — Encounter: Payer: Self-pay | Admitting: Internal Medicine

## 2022-12-18 ENCOUNTER — Telehealth: Payer: Self-pay | Admitting: Internal Medicine

## 2022-12-18 NOTE — Telephone Encounter (Signed)
Patient called to say that she was awaiting a phone call from the office manager and wanted to know when that might happen.  I explained that Natasha Love is out of the office today and tomorrow, but that Natasha Love is aware that she, Natasha Love, is awaiting a phone call to voice her complaint.  A message was sent to Natasha Love to let her know that the patient called.

## 2022-12-25 ENCOUNTER — Other Ambulatory Visit: Payer: Self-pay | Admitting: Internal Medicine

## 2022-12-25 DIAGNOSIS — Z1231 Encounter for screening mammogram for malignant neoplasm of breast: Secondary | ICD-10-CM

## 2022-12-26 NOTE — Progress Notes (Signed)
error 

## 2023-01-09 NOTE — Telephone Encounter (Signed)
 Patient called today and asked to speak to Administrator, Patient advised the she(Natasha Love) would be back in the office next week. Patient states she would like to give her side of what happened and is asking for a chance to clear her name. I placed the patient on hold and asked Comer Moose- what was the best way to handle this complaint. Comer advised to not the complaint and due to the patient being so irritated give the patient the number to the Pt. Engagement department. I returned to the call and instructed the patient that if she felt she couldn't wait on Natasha to return to office she was welcome to call the PE Department and voice her complaint. And that I would pass the message to [facility] when she returns to the office.
